# Patient Record
Sex: Female | Born: 1980 | Race: White | Hispanic: No | Marital: Married | Smoking: Never smoker
Health system: Southern US, Community
[De-identification: ages and names within clinical notes are randomized; demographics above are authoritative.]

## PROBLEM LIST (undated history)

## (undated) DIAGNOSIS — B977 Papillomavirus as the cause of diseases classified elsewhere: Secondary | ICD-10-CM

## (undated) DIAGNOSIS — Z87820 Personal history of traumatic brain injury: Secondary | ICD-10-CM

## (undated) DIAGNOSIS — F329 Major depressive disorder, single episode, unspecified: Secondary | ICD-10-CM

## (undated) DIAGNOSIS — F32A Depression, unspecified: Secondary | ICD-10-CM

## (undated) DIAGNOSIS — R87629 Unspecified abnormal cytological findings in specimens from vagina: Secondary | ICD-10-CM

## (undated) HISTORY — DX: Unspecified abnormal cytological findings in specimens from vagina: R87.629

## (undated) HISTORY — PX: LYMPH GLAND EXCISION: SHX13

## (undated) HISTORY — PX: NO PAST SURGERIES: SHX2092

## (undated) HISTORY — DX: Depression, unspecified: F32.A

## (undated) HISTORY — DX: Papillomavirus as the cause of diseases classified elsewhere: B97.7

## (undated) HISTORY — DX: Personal history of traumatic brain injury: Z87.820

---
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
## 2004-05-20 ENCOUNTER — Other Ambulatory Visit: Admission: RE | Admit: 2004-05-20 | Discharge: 2004-05-20 | Payer: Self-pay | Admitting: Cardiology

## 2005-06-26 ENCOUNTER — Other Ambulatory Visit: Admission: RE | Admit: 2005-06-26 | Discharge: 2005-06-26 | Payer: Self-pay | Admitting: *Deleted

## 2006-12-31 ENCOUNTER — Ambulatory Visit (HOSPITAL_COMMUNITY): Admission: RE | Admit: 2006-12-31 | Discharge: 2006-12-31 | Payer: Self-pay | Admitting: Surgery

## 2006-12-31 ENCOUNTER — Encounter (INDEPENDENT_AMBULATORY_CARE_PROVIDER_SITE_OTHER): Payer: Self-pay | Admitting: Surgery

## 2010-10-21 NOTE — Op Note (Signed)
Erin [name], Erin [name]                 ACCOUNT NO.:  [id number]   MEDICAL RECORD NO.:  [id number]          PATIENT TYPE:  AMB   LOCATION:  DAY                          FACILITY:  [facility]   PHYSICIAN:  Currie Paris, M.D.DATE OF BIRTH:  [birth year]   DATE OF PROCEDURE:  12/31/2006  DATE OF DISCHARGE:                               OPERATIVE REPORT   PREOPERATIVE DIAGNOSIS:  Cervical adenopathy.   POSTOPERATIVE DIAGNOSIS:  Cervical adenopathy.   OPERATION:  Excisional biopsy left cervical lymph node.   SURGEON:  Currie Paris, M.D.   ANESTHESIA:  Local.   CLINICAL HISTORY:  This is a 30 year old lady who has had some  adenopathy that has been followed.  This has never completely resolved  and she has developed a couple of other slightly enlarged nodes as well.  Clinically, they were all fairly soft but because of their persistence,  it was elected to proceed to a biopsy.   DESCRIPTION OF PROCEDURE:  The patient was seen in the holding area and  she had no further questions.  We confirmed the location of the node in  question in the left posterior cervical triangle and this was marked in  the holding area.  The patient was then taken to the operating room and  positioned on the operating table.  She was given some IV sedation and  the area over the lymph node was prepped and draped.  The time out  occurred.   I infiltrated 1% Xylocaine with some epinephrine, waited about 8 minutes  for that to soak in good.  I then made a short transverse incision.  I  used magnification for visualization.  I divided the subcu tissue with  the scissors and then also opened what appeared to be a very thin layer  of platysma.  A little bit of blunt dissection revealed the lymph node  and this was carefully dissected out, staying very close to the node.  What appeared to be the spinal accessory nerve was noted just posterior  and deep to this lymph node, but it was carefully avoided.  By  staying  close to the lymph node, I felt that we did not interfere with any other  superficial nerves.  This came out intact and was sent in saline to  pathology.   Since everything was dry, we went ahead and closed with some 4-0 Vicryl  plus Dermabond.  The patient tolerated the procedure well and there were  no complications.  All counts were correct.      Currie Paris, M.D.  Electronically Signed     CJS/MEDQ  D:  12/31/2006  T:  12/31/2006  Job:  [id number]   cc:   Soyla Murphy. Renne Crigler, M.D.  Fax: [phone]

## 2011-03-23 LAB — PREGNANCY, URINE: Preg Test, Ur: NEGATIVE

## 2011-03-23 LAB — HEMOGLOBIN AND HEMATOCRIT, BLOOD
HCT: 38.8
Hemoglobin: 13.1

## 2011-07-17 ENCOUNTER — Other Ambulatory Visit: Payer: Self-pay | Admitting: Dermatology

## 2017-04-19 DIAGNOSIS — Z87820 Personal history of traumatic brain injury: Secondary | ICD-10-CM

## 2017-04-19 HISTORY — DX: Personal history of traumatic brain injury: Z87.820

## 2017-12-28 DIAGNOSIS — Z6825 Body mass index (BMI) 25.0-25.9, adult: Secondary | ICD-10-CM | POA: Diagnosis not present

## 2017-12-28 DIAGNOSIS — Z01419 Encounter for gynecological examination (general) (routine) without abnormal findings: Secondary | ICD-10-CM | POA: Diagnosis not present

## 2018-01-28 ENCOUNTER — Encounter: Payer: Self-pay | Admitting: Family Medicine

## 2018-01-28 ENCOUNTER — Telehealth: Payer: Self-pay | Admitting: Family Medicine

## 2018-01-28 ENCOUNTER — Ambulatory Visit (INDEPENDENT_AMBULATORY_CARE_PROVIDER_SITE_OTHER): Payer: BLUE CROSS/BLUE SHIELD | Admitting: Family Medicine

## 2018-01-28 VITALS — BP 108/58 | HR 68 | Temp 98.6°F | Ht 67.5 in | Wt 165.0 lb

## 2018-01-28 DIAGNOSIS — Z13220 Encounter for screening for lipoid disorders: Secondary | ICD-10-CM

## 2018-01-28 DIAGNOSIS — E559 Vitamin D deficiency, unspecified: Secondary | ICD-10-CM

## 2018-01-28 DIAGNOSIS — Z114 Encounter for screening for human immunodeficiency virus [HIV]: Secondary | ICD-10-CM

## 2018-01-28 DIAGNOSIS — R42 Dizziness and giddiness: Secondary | ICD-10-CM | POA: Diagnosis not present

## 2018-01-28 DIAGNOSIS — N912 Amenorrhea, unspecified: Secondary | ICD-10-CM | POA: Diagnosis not present

## 2018-01-28 DIAGNOSIS — Z87820 Personal history of traumatic brain injury: Secondary | ICD-10-CM

## 2018-01-28 LAB — CBC WITH DIFFERENTIAL/PLATELET
Basophils Absolute: 0.1 10*3/uL (ref 0.0–0.1)
Basophils Relative: 0.8 % (ref 0.0–3.0)
Eosinophils Absolute: 0.2 10*3/uL (ref 0.0–0.7)
Eosinophils Relative: 2.8 % (ref 0.0–5.0)
HCT: 38.1 % (ref 36.0–46.0)
Hemoglobin: 12.8 g/dL (ref 12.0–15.0)
Lymphocytes Relative: 35.5 % (ref 12.0–46.0)
Lymphs Abs: 2.2 10*3/uL (ref 0.7–4.0)
MCHC: 33.5 g/dL (ref 30.0–36.0)
MCV: 82.3 fl (ref 78.0–100.0)
Monocytes Absolute: 0.5 10*3/uL (ref 0.1–1.0)
Monocytes Relative: 7.9 % (ref 3.0–12.0)
Neutro Abs: 3.3 10*3/uL (ref 1.4–7.7)
Neutrophils Relative %: 53 % (ref 43.0–77.0)
Platelets: 282 10*3/uL (ref 150.0–400.0)
RBC: 4.63 Mil/uL (ref 3.87–5.11)
RDW: 13.4 % (ref 11.5–15.5)
WBC: 6.3 10*3/uL (ref 4.0–10.5)

## 2018-01-28 LAB — COMPREHENSIVE METABOLIC PANEL
ALT: 8 U/L (ref 0–35)
AST: 11 U/L (ref 0–37)
Albumin: 4.3 g/dL (ref 3.5–5.2)
Alkaline Phosphatase: 43 U/L (ref 39–117)
BUN: 14 mg/dL (ref 6–23)
CO2: 29 mEq/L (ref 19–32)
Calcium: 9.5 mg/dL (ref 8.4–10.5)
Chloride: 106 mEq/L (ref 96–112)
Creatinine, Ser: 0.86 mg/dL (ref 0.40–1.20)
GFR: 77.98 mL/min (ref >60.00)
Glucose, Bld: 73 mg/dL (ref 70–99)
Potassium: 4.2 mEq/L (ref 3.5–5.1)
Sodium: 140 mEq/L (ref 135–145)
Total Bilirubin: 0.6 mg/dL (ref 0.2–1.2)
Total Protein: 6.8 g/dL (ref 6.0–8.3)

## 2018-01-28 LAB — LIPID PANEL
Cholesterol: 178 mg/dL (ref 0–200)
HDL: 44.1 mg/dL (ref >39.00)
LDL Cholesterol: 105 mg/dL — ABNORMAL HIGH (ref 0–99)
NonHDL: 133.65
Total CHOL/HDL Ratio: 4
Triglycerides: 143 mg/dL (ref 0.0–149.0)
VLDL: 28.6 mg/dL (ref 0.0–40.0)

## 2018-01-28 LAB — HCG, QUANTITATIVE, PREGNANCY: Quantitative HCG: 0.09 m[IU]/mL

## 2018-01-28 LAB — TSH: TSH: 1.69 u[IU]/mL (ref 0.35–4.50)

## 2018-01-28 LAB — VITAMIN B12: Vitamin B-12: 275 pg/mL (ref 211–911)

## 2018-01-28 LAB — VITAMIN D 25 HYDROXY (VIT D DEFICIENCY, FRACTURES): VITD: 23.14 ng/mL — ABNORMAL LOW (ref 30.00–100.00)
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
## 2018-01-28 NOTE — Telephone Encounter (Signed)
There is a possible concussion referral in the [facility] for this patient.  Can you please follow up to schedule on Monday? Thanks

## 2018-01-29 ENCOUNTER — Encounter: Payer: Self-pay | Admitting: Family Medicine

## 2018-01-29 LAB — HIV ANTIBODY (ROUTINE TESTING W REFLEX): HIV 1&2 Ab, 4th Generation: NONREACTIVE

## 2018-01-31 ENCOUNTER — Telehealth: Payer: Self-pay

## 2018-01-31 LAB — THYROID PEROXIDASE ANTIBODY: Thyroperoxidase Ab SerPl-aCnc: 7 IU/mL (ref <9)

## 2018-01-31 NOTE — Telephone Encounter (Signed)
Left message for patient to call back to be scheduled in concussion clinic.  
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
## 2018-02-03 ENCOUNTER — Ambulatory Visit (INDEPENDENT_AMBULATORY_CARE_PROVIDER_SITE_OTHER): Payer: BLUE CROSS/BLUE SHIELD | Admitting: Family Medicine

## 2018-02-03 ENCOUNTER — Encounter: Payer: Self-pay | Admitting: Family Medicine

## 2018-02-03 VITALS — BP 112/82 | HR 67 | Ht 67.5 in | Wt 165.0 lb

## 2018-02-03 DIAGNOSIS — R42 Dizziness and giddiness: Secondary | ICD-10-CM | POA: Diagnosis not present

## 2018-02-03 DIAGNOSIS — R293 Abnormal posture: Secondary | ICD-10-CM | POA: Diagnosis not present

## 2018-02-03 DIAGNOSIS — H8123 Vestibular neuronitis, bilateral: Secondary | ICD-10-CM | POA: Insufficient documentation

## 2018-02-03 MED ORDER — VITAMIN D (ERGOCALCIFEROL) 1.25 MG (50000 UNIT) PO CAPS: 50000.0000 [IU] | ORAL_CAPSULE | ORAL | 0 refills | Status: DC

## 2018-02-03 MED ORDER — PREDNISONE 50 MG PO TABS: 50.0000 mg | ORAL_TABLET | Freq: Every day | ORAL | 0 refills | Status: DC

## 2018-02-03 NOTE — Assessment & Plan Note (Signed)
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

## 2018-02-03 NOTE — Patient Instructions (Signed)
Good to see you  Once weekly vitamin D for 12 weeks Prednisone daily for 5 days  PT will call you or you can call them at [phone] Tart cherry extract any dose at night See me again in 3-4 weeks to make sure you are all the way better
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
## 2018-02-03 NOTE — Assessment & Plan Note (Signed)
Normally the patient does have more of a vestibular neuronitis that is likely contributing.  Patient does not have any other signs or symptoms that would be consistent with a postconcussive syndrome.  Discussed though that patient does have some difficulty with the vitamin D being low and this could be contributing to some of the slowing in the healing aspect.  Will refer patient to formal physical therapy that I think will be beneficial, discussed over-the-counter medications, did not give any exercises ourselves at this point.  Follow-up again in 3 to 4 weeks

## 2018-02-28 ENCOUNTER — Ambulatory Visit: Payer: BLUE CROSS/BLUE SHIELD | Admitting: Family Medicine

## 2018-03-04 ENCOUNTER — Other Ambulatory Visit: Payer: Self-pay

## 2018-03-04 ENCOUNTER — Encounter: Payer: Self-pay | Admitting: Physical Therapy

## 2018-03-04 ENCOUNTER — Ambulatory Visit: Payer: BLUE CROSS/BLUE SHIELD | Attending: Family Medicine | Admitting: Physical Therapy

## 2018-03-04 DIAGNOSIS — R42 Dizziness and giddiness: Secondary | ICD-10-CM
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
## 2018-03-08 ENCOUNTER — Ambulatory Visit: Payer: BLUE CROSS/BLUE SHIELD | Attending: Family Medicine | Admitting: Physical Therapy

## 2018-03-08 ENCOUNTER — Encounter: Payer: Self-pay | Admitting: Physical Therapy

## 2018-03-08 DIAGNOSIS — R42 Dizziness and giddiness: Secondary | ICD-10-CM

## 2018-03-08 NOTE — Therapy (Signed)
[facility] [address] [place], [place], [address] Phone: [phone]   Fax:  [phone]  Physical Therapy Treatment  Patient Details  Name: Erin [name] MRN: [id number] Date of Birth: 07-Nov-1980 Referring Provider (PT): Dr. Charlann Boxer   Encounter Date: 03/08/2018  PT End of Session - 03/08/18 0831    Visit Number  2    Number of Visits  5    Date for PT Re-Evaluation  04/01/18    PT Start Time  0830    PT Stop Time  0910    PT Time Calculation (min)  40 min    Activity Tolerance  Patient tolerated treatment well    Behavior During Therapy  [facility] for tasks assessed/performed       Past Medical History:  Diagnosis Date  . History of concussion 04/19/2017  . HPV in female     History reviewed. No pertinent surgical history.  There were no vitals filed for this visit.  Subjective Assessment - 03/08/18 0830    Subjective  no dizzy episodes since last Friday - but has not been to the gym or down to the floor - plans to go to the gym tonight; has been doing HEP with good compliance.    Pertinent History  no pertinent medical history    Diagnostic tests  CT: normal    Patient Stated Goals  improve dizziness    Currently in Pain?  No/denies             Vestibular Assessment - 03/08/18 0835      Positional Testing   Dix-Hallpike  Dix-Hallpike Right;Dix-Hallpike Left      Dix-Hallpike Right   Dix-Hallpike Right Symptoms  No nystagmus      Dix-Hallpike Left   Dix-Hallpike Left Symptoms  No nystagmus               [facility] Adult PT Treatment/Exercise - 03/08/18 0001      Exercises   Exercises  Neck      Neck Exercises: Stretches   Levator Stretch  Right;Left;3 reps;30 seconds      Vestibular Treatment/Exercise - 03/08/18 0001      Vestibular Treatment/Exercise   Vestibular Treatment Provided  Gaze    Gaze Exercises  X1 Viewing Horizontal;X1 Viewing Vertical;X2 Viewing Horizontal;X2  Viewing Vertical      X1 Viewing Horizontal   Foot Position  seated, standing feet apart, standing feet together    Time  --   30-45 sec   Reps  2   each position     X1 Viewing Vertical   Foot Position  seated, standing feet apart, standing feet together    Time  --   30-45 sec   Reps  2   each position     X2 Viewing Horizontal   Foot Position  seated    Time  --   30-45 sec   Reps  2      X2 Viewing Vertical   Foot Position  seated    Time  --   30-45 sec   Reps  2         Balance Exercises - 03/08/18 0858      Balance Exercises: Standing   Standing Eyes Closed  Narrow base of support (BOS);Foam/compliant surface;Head turns;30 secs;2 reps   2 reps EC; 2 reps EC + head turns; moderate sway   Tandem Stance  Eyes closed;Foam/compliant surface;3 reps;30 secs   moderate sway - 1 LOB  requiring UE support on wall         PT Short Term Goals - 03/04/18 1315      PT SHORT TERM GOAL #1   Title  STGs = LTGs        PT Long Term Goals - 03/04/18 1316      PT LONG TERM GOAL #1   Title  Patient to be independent with advanced HEP for cervical and vestibular activities    Time  4    Period  Weeks    Status  New    Target Date  04/01/18      PT LONG TERM GOAL #2   Title  patient to report reduction in frequency of dizziness related symptoms by >/= 50%    Time  4    Period  Weeks    Status  New    Target Date  04/01/18      PT LONG TERM GOAL #3   Title  patient to report ability to exercise without dizziness limiting    Time  4    Period  Weeks    Status  New    Target Date  04/01/18      PT LONG TERM GOAL #4   Title  patient to maintain balance with EC on various surfaces with minimal sway    Time  4    Period  Weeks    Status  New    Target Date  04/01/18            Plan - 03/08/18 1937    Clinical Impression Statement  Patient reporting no dizzy symptoms since last Friday - however has not attempted trying to go to the gym - plans for  today. Patient with improved toelrance to VOR x 1 today with ability to progress velocity of head turns as compared to last session. Progressed HEP with levator scap stretch as well as VOR x 2 with good tolerance and carryover. Making good progress towards goals.     Rehab Potential  Good    PT Frequency  1x / week    PT Duration  4 weeks    PT Treatment/Interventions  ADLs/Self Care Home Management;Canalith Repostioning;Cryotherapy;Electrical Stimulation;Traction;Moist Heat;Therapeutic activities;Therapeutic exercise;Balance training;Patient/family education;Neuromuscular re-education;Manual techniques;Vestibular;Taping;Dry needling    PT Next Visit Plan  re-assess as needed, progress VOR as able    PT Home Exercise Plan  [id number]    Consulted and Agree with Plan of Care  Patient       Patient will benefit from skilled therapeutic intervention in order to improve the following deficits and impairments:  Decreased balance, Dizziness, Postural dysfunction  Visit Diagnosis: Dizziness and giddiness     Problem List Patient Active Problem List   Diagnosis Date Noted  . 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 Poor posture 02/03/2018    [name], PT, DPT Supplemental Physical Therapist 03/08/18 9:12 AM Pager: [phone] Office: [facility] [address] [place], [place], [address] Phone: [phone]   Fax:  [phone]  Name: Erin [name] MRN: [id number] Date of Birth: 1980/07/01

## 2018-03-15 ENCOUNTER — Ambulatory Visit: Payer: BLUE CROSS/BLUE SHIELD | Admitting: Physical Therapy

## 2018-03-15 ENCOUNTER — Encounter: Payer: Self-pay | Admitting: Physical Therapy

## 2018-03-15 DIAGNOSIS — R42 Dizziness and giddiness: Secondary | ICD-10-CM

## 2018-03-15 NOTE — Therapy (Signed)
[facility] [address] [place], [place], [address] Phone: [phone]   Fax:  [phone]  Physical Therapy Treatment  Patient Details  Name: Erin [name] MRN: [id number] Date of Birth: 01-30-1981 Referring Provider (PT): Dr. Charlann Boxer   Encounter Date: 03/15/2018  PT End of Session - 03/15/18 0853    Visit Number  3    Number of Visits  5    Date for PT Re-Evaluation  04/01/18    PT Start Time  0851    PT Stop Time  0930    PT Time Calculation (min)  39 min    Activity Tolerance  Patient tolerated treatment well    Behavior During Therapy  [facility] for tasks assessed/performed       Past Medical History:  Diagnosis Date  . History of concussion 04/19/2017  . HPV in female     History reviewed. No pertinent surgical history.  There were no vitals filed for this visit.  Subjective Assessment - 03/15/18 0852    Subjective  had some dizziness - sitting on the floor and lying back; very stressful week    Pertinent History  no pertinent medical history    Diagnostic tests  CT: normal    Patient Stated Goals  improve dizziness    Currently in Pain?  No/denies             Vestibular Assessment - 03/15/18 0001      Positional Testing   Dix-Hallpike  Dix-Hallpike Right;Dix-Hallpike Left      Dix-Hallpike Right   Dix-Hallpike Right Symptoms  No nystagmus      Dix-Hallpike Left   Dix-Hallpike Left Duration  ~10 seconds    Dix-Hallpike Left Symptoms  Upbeat, left rotatory nystagmus               [facility] Adult PT Treatment/Exercise - 03/15/18 0001      Manual Therapy   Manual Therapy  Soft tissue mobilization    Manual therapy comments  patient supine    Soft tissue mobilization  STM to neck and upper back musculature; palpable tissue tightness noted with pain R>L. education on continued stretching and AORM      Vestibular Treatment/Exercise - 03/15/18 0001      Vestibular Treatment/Exercise    Vestibular Treatment Provided  Canalith Repositioning;Habituation;Gaze    Canalith Repositioning  Epley Manuever Left    Habituation Exercises  Comment   education on sit to supine habituation   Gaze Exercises  X1 Viewing Horizontal;X2 Viewing Horizontal       EPLEY MANUEVER LEFT   Number of Reps   2    Overall Response   Improved Symptoms     RESPONSE DETAILS LEFT  2 reps with little to no symptoms on 2nd rep; no evidence of nystagmus during second trial      X1 Viewing Horizontal   Foot Position  seated following Epley - minor increase in nausea    Time  --   30 sec   Reps  2      X2 Viewing Horizontal   Foot Position  standing feet together    Time  --   30 sec   Reps  2     Comments  increase in nausea symptoms with reduction in symptoms with rest and water         Balance Exercises - 03/15/18 0945      Balance Exercises: Standing   Standing Eyes Closed  Narrow base  of support (BOS);Foam/compliant surface;Head turns;30 secs;3 reps   1 set no head turns; 1 set with horiz. and vert. head turns         PT Short Term Goals - 03/04/18 1315      PT SHORT TERM GOAL #1   Title  STGs = LTGs        PT Long Term Goals - 03/04/18 1316      PT LONG TERM GOAL #1   Title  Patient to be independent with advanced HEP for cervical and vestibular activities    Time  4    Period  Weeks    Status  New    Target Date  04/01/18      PT LONG TERM GOAL #2   Title  patient to report reduction in frequency of dizziness related symptoms by >/= 50%    Time  4    Period  Weeks    Status  New    Target Date  04/01/18      PT LONG TERM GOAL #3   Title  patient to report ability to exercise without dizziness limiting    Time  4    Period  Weeks    Status  New    Target Date  04/01/18      PT LONG TERM GOAL #4   Title  patient to maintain balance with EC on various surfaces with minimal sway    Time  4    Period  Weeks    Status  New    Target Date  04/01/18             Plan - 03/15/18 0935    Clinical Impression Statement  Re-assessment of positional vertigo with positive response of L ear. Patient with subjective reports of room spinning and nausea with symptoms lasting ~10 seconds and resolving following Epley maneuver. Re-tested following maneuver with much improved symptoms. Progression of VOR x2 in standing with feet together with good tolerance. Manual therapy to B neck adn upper back musculature with palpable tightness - education to continue stretching and AROM exercises at C-spine. Hopeful for 1 follow-up next week and progression to independent HEP, however, depending on symptoms.     Rehab Potential  Good    PT Frequency  1x / week    PT Duration  4 weeks    PT Treatment/Interventions  ADLs/Self Care Home Management;Canalith Repostioning;Cryotherapy;Electrical Stimulation;Traction;Moist Heat;Therapeutic activities;Therapeutic exercise;Balance training;Patient/family education;Neuromuscular re-education;Manual techniques;Vestibular;Taping;Dry needling    PT Next Visit Plan  re-assess as needed, progress VOR as able    PT Home Exercise Plan  [id number]    Consulted and Agree with Plan of Care  Patient       Patient will benefit from skilled therapeutic intervention in order to improve the following deficits and impairments:  Decreased balance, Dizziness, Postural dysfunction  Visit Diagnosis: Dizziness and giddiness     Problem List Patient Active Problem List   Diagnosis Date Noted  . 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 Poor posture 02/03/2018    [name], PT, DPT Supplemental Physical Therapist 03/15/18 1:02 PM Pager: [phone] Office: [facility] [address] [place], [place], [address] Phone: [phone]   Fax:  [phone]  Name: Erin [name] MRN: [id number] Date of Birth: 08-05-1980

## 2018-03-15 NOTE — Patient Instructions (Signed)

## 2018-03-22 ENCOUNTER — Ambulatory Visit: Payer: BLUE CROSS/BLUE SHIELD | Admitting: Physical Therapy

## 2018-03-29 ENCOUNTER — Encounter: Payer: Self-pay | Admitting: Physical Therapy

## 2018-03-29 ENCOUNTER — Ambulatory Visit: Payer: BLUE CROSS/BLUE SHIELD | Admitting: Physical Therapy

## 2018-03-29 DIAGNOSIS — R42 Dizziness and giddiness: Secondary | ICD-10-CM

## 2018-03-29 NOTE — Therapy (Signed)
[facility] [address] [place], [place], [address] Phone: [phone]   Fax:  [phone]  Physical Therapy Treatment & Discharge Summary   Patient Details  Name: Erin [name] MRN: [id number] Date of Birth: [birth year] Referring Provider (PT): Dr. Charlann Boxer   Encounter Date: 03/29/2018  PT End of Session - 03/29/18 1027    Visit Number  4    Number of Visits  5    Date for PT Re-Evaluation  04/01/18    PT Start Time  0930    PT Stop Time  1000    PT Time Calculation (min)  30 min    Activity Tolerance  Patient tolerated treatment well    Behavior During Therapy  [facility] for tasks assessed/performed       Past Medical History:  Diagnosis Date  . History of concussion 04/19/2017  . HPV in female     History reviewed. No pertinent surgical history.  There were no vitals filed for this visit.  Subjective Assessment - 03/29/18 0934    Subjective  Reports her dizziness symptoms are improving. Reports mild dizziness when sitting back 2/2 son pushing her backwards. Believes her symptoms are dampened and improving overall.     Pertinent History  no pertinent medical history    Diagnostic tests  CT: normal    Patient Stated Goals  improve dizziness    Currently in Pain?  No/denies             Vestibular Assessment - 03/29/18 1019      Vestibulo-Occular Reflex   VOR 1 Head Only (x 1 viewing)  --    VOR 2 Head and Object (x 2 viewing)  --      Positional Testing   Dix-Hallpike  Dix-Hallpike Left      Dix-Hallpike Left   Dix-Hallpike Left Duration  5-10 seconds     Dix-Hallpike Left Symptoms  Upbeat, left rotatory nystagmus               [facility] Adult PT Treatment/Exercise - 03/29/18 1029      Balance   Balance Assessed  Yes      Static Standing Balance   Rhomberg - Eyes Closed  30   static standing eyes closed on foam with min sway      Vestibular Treatment/Exercise - 03/29/18 1022      Vestibular Treatment/Exercise   Vestibular Treatment Provided  Canalith Repositioning;Gaze    Canalith Repositioning  Epley Manuever Left    Gaze Exercises  X1 Viewing Horizontal;X2 Viewing Horizontal      X1 Viewing Horizontal   Foot Position  Pt performs x1 trial of x1 viewing in standing for 30 sec duration progressing to x1 trial for 60 sec duration with no reproduction of symptoms.     Time  --   30 seconds progressing to 1 min    Reps  2      X2 Viewing Horizontal   Foot Position  Pt performs x1 trial for 30 sec duration performing with horizontal head turns. Pt progresses to 60 sec duration with no reproduction of symptoms.    romberg standing   Time  --   30 seconds progressing to 1 min   Reps  2     Comments  no reproduction of symptoms       X2 Viewing Vertical   Foot Position  Pt performs x1 trial for 30 sec duration performing with vertical head turns. Progresses to 33  sec duration with no reproduction of symptoms.    romberg standing   Time  --   30 seconds progressing to 1 min   Reps  2            PT Education - 03/29/18 1026    Education Details  Therapist provided education regarding exam findings, updated gaze stabilization exercises, and to return to clinic if symptoms were to worsen.     Person(s) Educated  Patient    Methods  Explanation    Comprehension  Verbalized understanding;Returned demonstration       PT Short Term Goals - 03/04/18 1315      PT SHORT TERM GOAL #1   Title  STGs = LTGs        PT Long Term Goals - 03/29/18 1028      PT LONG TERM GOAL #1   Title  Patient to be independent with advanced HEP for cervical and vestibular activities    Time  4    Period  Weeks    Status  Achieved      PT LONG TERM GOAL #2   Title  patient to report reduction in frequency of dizziness related symptoms by >/= 50%    Time  4    Period  Weeks    Status  Achieved      PT LONG TERM GOAL #3   Title  patient to report ability to exercise  without dizziness limiting    Time  4    Period  Weeks    Status  Achieved      PT LONG TERM GOAL #4   Title  patient to maintain balance with EC on various surfaces with minimal sway    Time  4    Period  Weeks    Status  Achieved            Plan - 03/29/18 1030    Clinical Impression Statement  Today's skilled session focused on re assessment of positional vertigo, updating HEP gaze stabilization exercises, and checking LTG's. Therapist notes positive left beat upward rotary nystagmus during [facility] with patient reporting mild onset of dizziness. Therapist treated with x1 Epley manuever with reduction in symptoms. Therapist updated patient's gaze stabilization HEP exercises with patient reporting no reproduction of symptoms with increased time. Pt demonstrates improvement in standing balance indicated by minimal sway noted while standing on foam with her eyes closed to increase challenge on her vestibular system. Overall, patient has met 4/4 LTG's demonstrating independence with HEP, reporting decreased frequency of dizziness with ability to exercise without dizziness onset, and improved standing balance with increased vestibular challenge. Therapist recommends patient be discharged from PT and educates patient to continue performing her habituation and gaze stabilization exercises at home.     Rehab Potential  Good    PT Frequency  1x / week    PT Duration  4 weeks    PT Treatment/Interventions  ADLs/Self Care Home Management;Canalith Repostioning;Cryotherapy;Electrical Stimulation;Traction;Moist Heat;Therapeutic activities;Therapeutic exercise;Balance training;Patient/family education;Neuromuscular re-education;Manual techniques;Vestibular;Taping;Dry needling    PT Next Visit Plan  --    [place]    Consulted and Agree with Plan of Care  Patient       Patient will benefit from skilled therapeutic intervention in order to improve the following deficits  and impairments:  Decreased balance, Dizziness, Postural dysfunction  Visit Diagnosis: Dizziness and giddiness     Problem List Patient Active Problem List   Diagnosis Date  Noted  . 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 Poor posture 02/03/2018    Floreen Comber, SPT 03/29/2018, 10:36 AM   PHYSICAL THERAPY DISCHARGE SUMMARY  Visits from Start of Care: 4  Current functional level related to goals / functional outcomes: All LTGs met; able to return to gym without symptom provocation   Remaining deficits: Mild dizziness, however, patient reports independence with symptom management and prevention   Education / Equipment: HEP  Plan: Patient agrees to discharge.  Patient goals were met. Patient is being discharged due to meeting the stated rehab goals.  ?????    [name], PT, DPT Supplemental Physical Therapist 03/29/18 10:48 AM Pager: [phone] Office: [facility] [address] [place], [place], [address] Phone: [phone]   Fax:  [phone]  Name: Erin [name] MRN: [id number] Date of Birth: 09-01-1980

## 2018-04-01 DIAGNOSIS — Z23 Encounter for immunization: Secondary | ICD-10-CM | POA: Diagnosis not present

## 2018-04-21 DIAGNOSIS — N911 Secondary amenorrhea: Secondary | ICD-10-CM | POA: Diagnosis not present

## 2018-04-25 ENCOUNTER — Other Ambulatory Visit: Payer: Self-pay | Admitting: Family Medicine

## 2018-04-28 DIAGNOSIS — Z3685 Encounter for antenatal screening for Streptococcus B: Secondary | ICD-10-CM | POA: Diagnosis not present

## 2018-04-28 DIAGNOSIS — Z3481 Encounter for supervision of other normal pregnancy, first trimester: Secondary | ICD-10-CM | POA: Diagnosis not present

## 2018-04-28 LAB — OB RESULTS CONSOLE ANTIBODY SCREEN: Antibody Screen: NEGATIVE

## 2018-04-28 LAB — OB RESULTS CONSOLE RUBELLA ANTIBODY, IGM: Rubella: IMMUNE

## 2018-04-28 LAB — OB RESULTS CONSOLE ABO/RH: RH Type: POSITIVE

## 2018-04-28 LAB — OB RESULTS CONSOLE GC/CHLAMYDIA
Chlamydia: NEGATIVE
Gonorrhea: NEGATIVE

## 2018-04-28 LAB — OB RESULTS CONSOLE HEPATITIS B SURFACE ANTIGEN: Hepatitis B Surface Ag: NEGATIVE

## 2018-04-28 LAB — OB RESULTS CONSOLE HIV ANTIBODY (ROUTINE TESTING): HIV: NONREACTIVE

## 2018-04-28 LAB — OB RESULTS CONSOLE RPR: RPR: NONREACTIVE

## 2018-05-09 DIAGNOSIS — Z113 Encounter for screening for infections with a predominantly sexual mode of transmission: Secondary | ICD-10-CM | POA: Diagnosis not present

## 2018-05-09 DIAGNOSIS — Z3491 Encounter for supervision of normal pregnancy, unspecified, first trimester: Secondary | ICD-10-CM | POA: Diagnosis not present

## 2018-05-26 DIAGNOSIS — O09521 Supervision of elderly multigravida, first trimester: Secondary | ICD-10-CM | POA: Diagnosis not present

## 2018-05-26 DIAGNOSIS — Z3682 Encounter for antenatal screening for nuchal translucency: Secondary | ICD-10-CM | POA: Diagnosis not present

## 2018-05-26 DIAGNOSIS — Z3A12 12 weeks gestation of pregnancy: Secondary | ICD-10-CM | POA: Diagnosis not present

## 2018-06-09 NOTE — Progress Notes (Signed)
   Erin [name] is a 38 y.o. female here for an acute visit.  History of Present Illness:   Erin [name], CMA acting as scribe for Dr. Briscoe Deutscher.   HPI: Growth above right elbow. Patient noticed about four months ago. It is not itchy, painful or red. She has never noticed that the skin is changed in any way. She can not tell if their has been any change in size. She denies any injury to the area. She is [redacted] weeks pregnant.    PMHx, SurgHx, SocialHx, Medications, and Allergies were reviewed in the Visit Navigator and updated as appropriate.  Current Medications:   Marland Kitchen  Multiple Vitamin (MULTIVITAMIN) tablet, Take 1 tablet by mouth daily., Disp: , Rfl:    No Known Allergies   Review of Systems:   Pertinent items are noted in the HPI. Otherwise, ROS is negative.  Vitals:   Vitals:   06/10/18 0759  BP: 110/82  Pulse: 85  Temp: 98.6 F (37 C)  TempSrc: Oral  Weight: 174 lb 9.6 oz (79.2 kg)  Height: 5' 7.5" (1.715 m)     Body mass index is 26.94 kg/m.  Physical Exam:   Physical Exam Vitals signs and nursing note reviewed.  HENT:     Head: Normocephalic and atraumatic.  Eyes:     Pupils: Pupils are equal, round, and reactive to light.  Neck:     Musculoskeletal: Normal range of motion and neck supple.  Cardiovascular:     Rate and Rhythm: Normal rate and regular rhythm.     Heart sounds: Normal heart sounds.  Pulmonary:     Effort: Pulmonary effort is normal.  Abdominal:     Palpations: Abdomen is soft.  Skin:    General: Skin is warm.  Psychiatric:        Behavior: Behavior normal.       Assessment and Plan:   Erin [name] was seen today for cyst.  Diagnoses and all orders for this visit:  Mass of skin of elbow, right Comments: Unclear etiology but benign appearing. Possible chronic mechanical irritation. Will bandage and protect for two weeks to see if this helps. If not, to Dr. Paulla [name].  [redacted] weeks gestation of pregnancy   . Reviewed expectations re:  course of current medical issues. . Discussed self-management of symptoms. . Outlined signs and symptoms indicating need for more acute intervention. . Patient verbalized understanding and all questions were answered. Marland Kitchen Health Maintenance issues including appropriate healthy diet, exercise, and smoking avoidance were discussed with patient. . See orders for this visit as documented in the electronic medical record. . Patient received an After Visit Summary.  CMA served as Education administrator during this visit. History, Physical, and Plan performed by medical provider. The above documentation has been reviewed and is accurate and complete. Briscoe Deutscher, D.O.  Briscoe Deutscher, DO [place], [place] [facility] 06/10/2018

## 2018-06-10 ENCOUNTER — Ambulatory Visit (INDEPENDENT_AMBULATORY_CARE_PROVIDER_SITE_OTHER): Payer: BLUE CROSS/BLUE SHIELD | Admitting: Family Medicine

## 2018-06-10 ENCOUNTER — Encounter: Payer: Self-pay | Admitting: Family Medicine

## 2018-06-10 VITALS — BP 110/82 | HR 85 | Temp 98.6°F | Ht 67.5 in | Wt 174.6 lb

## 2018-06-10 DIAGNOSIS — R2231 Localized swelling, mass and lump, right upper limb: Secondary | ICD-10-CM | POA: Diagnosis not present

## 2018-06-10 DIAGNOSIS — Z3A14 14 weeks gestation of pregnancy: Secondary | ICD-10-CM | POA: Diagnosis not present

## 2018-06-10 NOTE — Patient Instructions (Signed)
We are bandaging your elbow. Keep it cushioned for two weeks. If not better, I want you to see Dr. Paulla Fore for ultrasound evaluation.

## 2018-07-13 DIAGNOSIS — Z361 Encounter for antenatal screening for raised alphafetoprotein level: Secondary | ICD-10-CM | POA: Diagnosis not present

## 2018-07-13 DIAGNOSIS — Z3A19 19 weeks gestation of pregnancy: Secondary | ICD-10-CM | POA: Diagnosis not present

## 2018-07-13 DIAGNOSIS — Z363 Encounter for antenatal screening for malformations: Secondary | ICD-10-CM | POA: Diagnosis not present

## 2018-07-13 DIAGNOSIS — Z3480 Encounter for supervision of other normal pregnancy, unspecified trimester: Secondary | ICD-10-CM | POA: Diagnosis not present

## 2018-07-13 DIAGNOSIS — O09522 Supervision of elderly multigravida, second trimester: Secondary | ICD-10-CM | POA: Diagnosis not present

## 2018-07-16 ENCOUNTER — Encounter: Payer: Self-pay | Admitting: Family Medicine

## 2018-07-18 ENCOUNTER — Telehealth: Payer: Self-pay | Admitting: Family Medicine

## 2018-07-18 NOTE — Telephone Encounter (Signed)
See other message for documentation.

## 2018-07-18 NOTE — Telephone Encounter (Signed)
Copied from [place]. Topic: General - Other >> Jul 18, 2018  9:25 AM Judyann Munson wrote: Reason for CRM: Patient is calling to state she was advise if her Mass on her right elbow did not go down to come in for a ultrasound. She is requesting a order be sent to DR. Paulla Fore. Please advise

## 2018-07-18 NOTE — Telephone Encounter (Signed)
Please advise ok to see? If so do I need to put in as new patient or something else?

## 2018-07-18 NOTE — Telephone Encounter (Signed)
Yes, there is another message about this already.

## 2018-07-18 NOTE — Telephone Encounter (Signed)
Ok to order 

## 2018-07-21 ENCOUNTER — Encounter: Payer: Self-pay | Admitting: Sports Medicine

## 2018-07-21 ENCOUNTER — Ambulatory Visit (INDEPENDENT_AMBULATORY_CARE_PROVIDER_SITE_OTHER): Payer: BLUE CROSS/BLUE SHIELD | Admitting: Sports Medicine

## 2018-07-21 ENCOUNTER — Ambulatory Visit: Payer: Self-pay

## 2018-07-21 VITALS — BP 90/54 | HR 86 | Ht 67.5 in | Wt 181.0 lb

## 2018-07-21 DIAGNOSIS — M7989 Other specified soft tissue disorders: Secondary | ICD-10-CM | POA: Diagnosis not present

## 2018-07-21 DIAGNOSIS — M79631 Pain in right forearm: Secondary | ICD-10-CM | POA: Diagnosis not present

## 2018-07-21 DIAGNOSIS — R2231 Localized swelling, mass and lump, right upper limb: Secondary | ICD-10-CM

## 2018-07-21 NOTE — Progress Notes (Signed)
Erin [name]. Erin [name], [name] - 38 y.o. female MRN [id number]  Date of birth: 13-Jan-1981  Visit Date: July 24, 2018  PCP: Briscoe Deutscher, DO   Referred by: Briscoe Deutscher, DO  SUBJECTIVE:  Chief Complaint  Patient presents with  . New Patient (Initial Visit)    Bump on R arm.  Erin pt.    HPI: Patient presents with 4 months of worsening swelling and palpable deformity of the posterior aspect of the proximal forearm directly over the ulna.  There is no pain but there is increasing swelling and irregularity of the tissue.  She is currently pregnant and has noticed that this is been worsening since becoming pregnant.  There is slight stippling of the skin.  She denies any limitations in her range of motion.  She is not taking any medications but has tried compression x2 weeks with only minimal improvement.  She does rest her elbow at work some but does not notice any discomfort  REVIEW OF SYSTEMS: She has had some changes in urinary habits due to the increased frequency from pregnancy.  She does have a history of migraines but denies any dizziness.  No other associated weight gain or weight loss.  Pregnancy is uncomplicated at this time. Otherwise 12 point review of systems performed and is negative   HISTORY:  Prior history reviewed and updated per electronic medical record.  Patient Active Problem List   Diagnosis Date Noted  . 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 Poor posture 02/03/2018   Social History   Occupational History  . Not on file  Tobacco Use  . Smoking status: Never Smoker  . Smokeless tobacco: Never Used  Substance and Sexual Activity  . Alcohol use: Yes    Alcohol/week: 1.0 standard drinks    Types: 1 Glasses of wine per week  . Drug use: Never  . Sexual activity: Not on file   Social History   Social History  Narrative  . Not on file   Past Medical History:  Diagnosis Date  . History of concussion 04/19/2017  . HPV in female    History reviewed. No pertinent surgical history. family history includes Breast cancer (age of onset: 49) in her paternal grandmother; Heart murmur in her brother; High Cholesterol in her mother; Macular degeneration in her maternal grandfather; Obesity in her father; Stroke in her paternal grandfather. Recent Labs    01/28/18 1006  CREATININE 0.86  CALCIUM 9.5  AST 11  ALT 8  TSH 1.69    OBJECTIVE:  VS:  HT:5' 7.5" (171.5 cm)   WT:181 lb (82.1 kg)  BMI:27.91    BP:(!) 90/54  HR:86bpm  TEMP: ( )  RESP:99 %   PHYSICAL EXAM: CONSTITUTIONAL: Well-developed, Well-nourished and In no acute distress EYES: Pupils are equal., EOM intact without nystagmus. and No scleral icterus. Psychiatric: Alert & appropriately interactive. and Not depressed or anxious appearing. EXTREMITY EXAM: Warm and well perfused  Right forearm is overall well aligned without significant deformity.  She does have a slight irregularity on the posterior aspect of the ulna with some stippling of the skin but no gross swelling or area of fluctuance.  Slight darkening of the skin in a stippled pattern.  No erythema.  Sensation is intact light touch.   ASSESSMENT:  1. Right forearm pain   2. Mass of skin of elbow, right  3. Fat necrosis of skin     PROCEDURES:  None  PLAN:  Pertinent additional documentation may be included in corresponding procedure notes, imaging studies, problem based documentation and patient instructions.  No problem-specific Assessment & Plan notes found for this encounter.   Symptoms are consistent with a area of slight traumatic superficial fat necrosis consistent with pressure related changes.  No evidence of overt bursitis.  Discussed the importance of avoiding direct compression over this area and additional padding with a compression sleeve or Ace  wrap.  Activity modifications and the importance of avoiding exacerbating activities (limiting pain to no more than a 4 / 10 during or following activity) recommended and discussed.  Discussed red flag symptoms that warrant earlier emergent evaluation and patient voices understanding.   No orders of the defined types were placed in this encounter.  Lab Orders  No laboratory test(s) ordered today    Imaging Orders     [place] [facility] POCT ULTRASOUND Referral Orders  No referral(s) requested today    Return if symptoms worsen or fail to improve.          Gerda Diss, [facility] Physician

## 2018-07-24 NOTE — Procedures (Signed)
LIMITED MSK ULTRASOUND OF Right elbow Images were obtained and interpreted by myself, Teresa Coombs, DO  Images have been saved and stored to PACS system. Images obtained on: GE S7 Ultrasound machine  FINDINGS:   Skin directly of the ulna has some increased area of hypoechoic change within the subcutaneous tissue with minimally associated neovascularity.  This is a diffuse finding.  There is no evidence of bursitis.  No osseous irregularity.  Superficial tissue appears to be deep only involved area.  IMPRESSION:  1. Superficial fat necrosis secondary to pressure related changes.

## 2018-08-01 ENCOUNTER — Ambulatory Visit: Payer: BLUE CROSS/BLUE SHIELD | Admitting: Family Medicine

## 2018-08-04 NOTE — Progress Notes (Signed)
Erin [name] is a 38 y.o. female is here for follow up.  History of Present Illness:   HPI: See Assessment and Plan section for Problem Based Charting of issues discussed today.   Health Maintenance Due  Topic Date Due  . PAP SMEAR-Modifier  11/03/1999   Depression screen PHQ 2/9 01/28/2018  Decreased Interest 0  Down, Depressed, Hopeless 0  PHQ - 2 Score 0  Altered sleeping 0  Tired, decreased energy 0  Change in appetite 0  Feeling bad or failure about yourself  0  Trouble concentrating 0  Moving slowly or fidgety/restless 0  Suicidal thoughts 0  PHQ-9 Score 0   PMHx, SurgHx, SocialHx, FamHx, Medications, and Allergies were reviewed in the Visit Navigator and updated as appropriate.   Patient Active Problem List   Diagnosis Date Noted  . Currently pregnant 08/06/2018  . 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 Poor posture 02/03/2018   Social History   Tobacco Use  . Smoking status: Never Smoker  . Smokeless tobacco: Never Used  Substance Use Topics  . Alcohol use: Yes    Alcohol/week: 1.0 standard drinks    Types: 1 Glasses of wine per week  . Drug use: Never   Current Medications and Allergies:   Current Outpatient Medications:  Marland Kitchen  Multiple Vitamin (MULTIVITAMIN) tablet, Take 1 tablet by mouth daily., Disp: , Rfl:  .  Prenatal Vit-Fe Fumarate-FA (PRENATAL 1+1 PO), Prenatal, Disp: , Rfl:   No Known Allergies Review of Systems   Pertinent items are noted in the HPI. Otherwise, ROS is negative.  Vitals:   Vitals:   08/05/18 1440  BP: 108/62  Pulse: 83  Temp: 98.1 F (36.7 C)  TempSrc: Oral  SpO2: 99%  Weight: 183 lb 12.8 oz (83.4 kg)  Height: 5\' 8"  (1.727 m)     Body mass index is 27.95 kg/m.  Physical Exam:   Physical Exam  Results for orders placed or performed in visit on 01/28/18  HIV antibody  Result Value Ref Range   HIV 1&2 Ab, 4th Generation NON-REACTIVE NON-REACTI  CBC with  Differential/Platelet  Result Value Ref Range   WBC 6.3 4.0 - 10.5 K/uL   RBC 4.63 3.87 - 5.11 Mil/uL   Hemoglobin 12.8 12.0 - 15.0 g/dL   HCT 38.1 36.0 - 46.0 %   MCV 82.3 78.0 - 100.0 fl   MCHC 33.5 30.0 - 36.0 g/dL   RDW 13.4 11.5 - 15.5 %   Platelets 282.0 150.0 - 400.0 K/uL   Neutrophils Relative % 53.0 43.0 - 77.0 %   Lymphocytes Relative 35.5 12.0 - 46.0 %   Monocytes Relative 7.9 3.0 - 12.0 %   Eosinophils Relative 2.8 0.0 - 5.0 %   Basophils Relative 0.8 0.0 - 3.0 %   Neutro Abs 3.3 1.4 - 7.7 K/uL   Lymphs Abs 2.2 0.7 - 4.0 K/uL   Monocytes Absolute 0.5 0.1 - 1.0 K/uL   Eosinophils Absolute 0.2 0.0 - 0.7 K/uL   Basophils Absolute 0.1 0.0 - 0.1 K/uL  Comprehensive metabolic panel  Result Value Ref Range   Sodium 140 135 - 145 mEq/L   Potassium 4.2 3.5 - 5.1 mEq/L   Chloride 106 96 - 112 mEq/L   CO2 29 19 - 32 mEq/L   Glucose, Bld 73 70 - 99 mg/dL   BUN 14 6 - 23 mg/dL   Creatinine, Ser 0.86 0.40 - 1.20 mg/dL   Total  Bilirubin 0.6 0.2 - 1.2 mg/dL   Alkaline Phosphatase 43 39 - 117 U/L   AST 11 0 - 37 U/L   ALT 8 0 - 35 U/L   Total Protein 6.8 6.0 - 8.3 g/dL   Albumin 4.3 3.5 - 5.2 g/dL   Calcium 9.5 8.4 - 10.5 mg/dL   GFR 77.98 >60.00 mL/min  Lipid panel  Result Value Ref Range   Cholesterol 178 0 - 200 mg/dL   Triglycerides 143.0 0.0 - 149.0 mg/dL   HDL 44.10 >39.00 mg/dL   VLDL 28.6 0.0 - 40.0 mg/dL   LDL Cholesterol 105 (H) 0 - 99 mg/dL   Total CHOL/HDL Ratio 4    NonHDL 133.65   TSH  Result Value Ref Range   TSH 1.69 0.35 - 4.50 uIU/mL  Vitamin B12  Result Value Ref Range   Vitamin B-12 275 211 - 911 pg/mL  VITAMIN D 25 Hydroxy (Vit-D Deficiency, Fractures)  Result Value Ref Range   VITD 23.14 (L) 30.00 - 100.00 ng/mL  Thyroid peroxidase antibody  Result Value Ref Range   Thyroperoxidase Ab SerPl-aCnc 7 <9 IU/mL  B-HCG Quant  Result Value Ref Range   Quantitative HCG 0.09 mIU/ml    Assessment and Plan:   Doing well. Pregnant and due this  summer. Will not find out sex of baby until delivery. Has a son that is 3. Still working full time.  She does believe she had a history of postpartum depression looking back at her previous delivery.  We had a long discussion regarding postpartum depression symptoms and treatment options.  She had breast-feeding issues with her son as well so we discussed plans for lactation specialist.  She did see sports medicine for evaluation of the skin change to her right elbow.  Dr. Paulla Fore did also think that it had to do with a mechanical issue.  I did let the patient know that if she would like further evaluation after her delivery that she can let me know.  Erin [name] was seen today for follow-up.  Diagnoses and all orders for this visit:  Fat necrosis of skin  Pregnancy, unspecified gestational age   . Reviewed expectations re: course of current medical issues. . Discussed self-management of symptoms. . Outlined signs and symptoms indicating need for more acute intervention. . Patient verbalized understanding and all questions were answered. Marland Kitchen Health Maintenance issues including appropriate healthy diet, exercise, and smoking avoidance were discussed with patient. . See orders for this visit as documented in the electronic medical record. . Patient received an After Visit Summary.  Briscoe Deutscher, DO [place], [facility] 08/06/2018

## 2018-08-05 ENCOUNTER — Ambulatory Visit (INDEPENDENT_AMBULATORY_CARE_PROVIDER_SITE_OTHER): Payer: BLUE CROSS/BLUE SHIELD | Admitting: Family Medicine

## 2018-08-05 ENCOUNTER — Encounter: Payer: Self-pay | Admitting: Family Medicine

## 2018-08-05 VITALS — BP 108/62 | HR 83 | Temp 98.1°F | Ht 68.0 in | Wt 183.8 lb

## 2018-08-05 DIAGNOSIS — M7989 Other specified soft tissue disorders: Secondary | ICD-10-CM | POA: Diagnosis not present

## 2018-08-05 DIAGNOSIS — Z3490 Encounter for supervision of normal pregnancy, unspecified, unspecified trimester: Secondary | ICD-10-CM

## 2018-08-05 NOTE — Patient Instructions (Signed)
I am excited for you to find out if you get to reuse the boy clothes!  Let me know right away if you experience post partum depression.  Also call if you'd like me to refer you to Dermatology.

## 2018-08-06 ENCOUNTER — Encounter: Payer: Self-pay | Admitting: Family Medicine

## 2018-08-06 DIAGNOSIS — Z3490 Encounter for supervision of normal pregnancy, unspecified, unspecified trimester: Secondary | ICD-10-CM | POA: Insufficient documentation

## 2018-08-10 DIAGNOSIS — Z362 Encounter for other antenatal screening follow-up: Secondary | ICD-10-CM | POA: Diagnosis not present

## 2018-08-10 DIAGNOSIS — Z3A23 23 weeks gestation of pregnancy: Secondary | ICD-10-CM | POA: Diagnosis not present

## 2018-09-03 DIAGNOSIS — J019 Acute sinusitis, unspecified: Secondary | ICD-10-CM | POA: Diagnosis not present

## 2018-09-09 DIAGNOSIS — Z361 Encounter for antenatal screening for raised alphafetoprotein level: Secondary | ICD-10-CM | POA: Diagnosis not present

## 2018-09-09 DIAGNOSIS — Z3480 Encounter for supervision of other normal pregnancy, unspecified trimester: Secondary | ICD-10-CM | POA: Diagnosis not present

## 2018-09-30 DIAGNOSIS — Z23 Encounter for immunization: Secondary | ICD-10-CM | POA: Diagnosis not present

## 2018-10-20 DIAGNOSIS — O9903 Anemia complicating the puerperium: Secondary | ICD-10-CM | POA: Diagnosis not present

## 2018-10-20 DIAGNOSIS — Z3A33 33 weeks gestation of pregnancy: Secondary | ICD-10-CM | POA: Diagnosis not present

## 2018-10-31 ENCOUNTER — Encounter: Payer: Self-pay | Admitting: Family Medicine

## 2018-11-02 ENCOUNTER — Ambulatory Visit (INDEPENDENT_AMBULATORY_CARE_PROVIDER_SITE_OTHER): Payer: BLUE CROSS/BLUE SHIELD | Admitting: Physician Assistant

## 2018-11-02 ENCOUNTER — Encounter: Payer: Self-pay | Admitting: Physician Assistant

## 2018-11-02 VITALS — Ht 68.0 in | Wt 186.0 lb

## 2018-11-02 DIAGNOSIS — W57XXXA Bitten or stung by nonvenomous insect and other nonvenomous arthropods, initial encounter: Secondary | ICD-10-CM

## 2018-11-02 DIAGNOSIS — S30860A Insect bite (nonvenomous) of lower back and pelvis, initial encounter: Secondary | ICD-10-CM

## 2018-11-02 MED ORDER — MUPIROCIN 2 % EX OINT: TOPICAL_OINTMENT | CUTANEOUS | 0 refills | Status: DC

## 2018-11-02 NOTE — Progress Notes (Signed)
Virtual Visit via Video   I connected with Erin [name] on 11/02/18 at  1:00 PM EDT by a video enabled telemedicine application and verified that I am speaking with the correct person using two identifiers. Location patient: Home Location provider: [facility], Office Persons participating in the virtual visit: CATE ORAVEC, Inda Coke PA-C, Anselmo Pickler, LPN   I discussed the limitations of evaluation and management by telemedicine and the availability of in person appointments. The patient expressed understanding and agreed to proceed.  I acted as a Education administrator for [employer], PA-C [employer], LPN  Subjective:   HPI:  Tick bite Pt was out over the weekend hiking, found a tick on her Monday afternoon center of mons pubis and removed the tick with a tweezer. The area is red and swollen and itching. She applied Neosporin and Calamine Lotion.  Denies discharge from area, unusual body aches, neck pain, unusual fatigue, fever. She states that the area is improving daily.  She is presently [redacted] weeks pregnant.  ROS: See pertinent positives and negatives per HPI.  Patient Active Problem List   Diagnosis Date Noted  . Currently pregnant 08/06/2018  . 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 Poor posture 02/03/2018    Social History   Tobacco Use  . Smoking status: Never Smoker  . Smokeless tobacco: Never Used  Substance Use Topics  . Alcohol use: Yes    Alcohol/week: 1.0 standard drinks    Types: 1 Glasses of wine per week    Current Outpatient Medications:  .  Prenatal Vit-Fe Fumarate-FA (PRENATAL 1+1 PO), Prenatal, Disp: , Rfl:  .  mupirocin ointment (BACTROBAN) 2 %, Apply to affected area 1-2 x daily, Disp: 22 g, Rfl: 0  No Known Allergies  Objective:   VITALS: Per patient if applicable, see vitals. GENERAL: Alert, appears well and in no acute distress. HEENT: Atraumatic, conjunctiva clear, no obvious  abnormalities on inspection of external nose and ears. NECK: Normal movements of the head and neck. CARDIOPULMONARY: No increased WOB. Speaking in clear sentences. I:E ratio WNL.  MS: Moves all visible extremities without noticeable abnormality. PSYCH: Pleasant and cooperative, well-groomed. Speech normal rate and rhythm. Affect is appropriate. Insight and judgement are appropriate. Attention is focused, linear, and appropriate.  NEURO: CN grossly intact. Oriented as arrived to appointment on time with no prompting. Moves both UE equally.  SKIN: Small pinpoint area in center of mons pubis with slight raised appearance; no target-like rash  Assessment and Plan:   Zuha was seen today for tick removal.  Diagnoses and all orders for this visit:  Insect bite of pelvic region, initial encounter No red flags on discussion with patient. Improving with time. Recommended OTC hydrocortisone to help with itching. Avoid neosporin, may use Bactroban prn. Red flags reviewed and worsening precautions advised.  Other orders -     mupirocin ointment (BACTROBAN) 2 %; Apply to affected area 1-2 x daily    . Reviewed expectations re: course of current medical issues. . Discussed self-management of symptoms. . Outlined signs and symptoms indicating need for more acute intervention. . Patient verbalized understanding and all questions were answered. Marland Kitchen Health Maintenance issues including appropriate healthy diet, exercise, and smoking avoidance were discussed with patient. . See orders for this visit as documented in the electronic medical record.  I discussed the assessment and treatment plan with the patient. The patient was provided an opportunity to ask questions and all  were answered. The patient agreed with the plan and demonstrated an understanding of the instructions.   The patient was advised to call back or seek an in-person evaluation if the symptoms worsen or if the condition fails to improve as  anticipated.   CMA or LPN served as scribe during this visit. History, Physical, and Plan performed by medical provider. The above documentation has been reviewed and is accurate and complete.  [place], [place] 11/02/2018

## 2018-11-11 DIAGNOSIS — Z3685 Encounter for antenatal screening for Streptococcus B: Secondary | ICD-10-CM | POA: Diagnosis not present

## 2018-11-16 DIAGNOSIS — Z3A37 37 weeks gestation of pregnancy: Secondary | ICD-10-CM | POA: Diagnosis not present

## 2018-11-16 DIAGNOSIS — Z3688 Encounter for antenatal screening for fetal macrosomia: Secondary | ICD-10-CM | POA: Diagnosis not present

## 2018-11-22 ENCOUNTER — Telehealth (HOSPITAL_COMMUNITY): Payer: Self-pay | Admitting: *Deleted

## 2018-11-22 ENCOUNTER — Encounter (HOSPITAL_COMMUNITY): Payer: Self-pay | Admitting: *Deleted

## 2018-11-22 NOTE — Telephone Encounter (Signed)
Preadmission screen  

## 2018-11-23 ENCOUNTER — Encounter (HOSPITAL_COMMUNITY): Payer: Self-pay | Admitting: *Deleted

## 2018-11-30 ENCOUNTER — Other Ambulatory Visit: Payer: Self-pay

## 2018-11-30 ENCOUNTER — Other Ambulatory Visit (HOSPITAL_COMMUNITY)
Admission: RE | Admit: 2018-11-30 | Discharge: 2018-11-30 | Disposition: A | Discharge status: Home / Self Care | Payer: BC Managed Care – PPO | Source: Ambulatory Visit | Attending: Obstetrics and Gynecology | Admitting: Obstetrics and Gynecology

## 2018-11-30 DIAGNOSIS — Z1159 Encounter for screening for other viral diseases: Secondary | ICD-10-CM | POA: Insufficient documentation

## 2018-11-30 LAB — SARS CORONAVIRUS 2 (TAT 6-24 HRS): SARS Coronavirus 2: NEGATIVE

## 2018-11-30 NOTE — MAU Note (Signed)
Covid swab collected. Pt tolerated well. Pt asymptomatic 

## 2018-12-01 ENCOUNTER — Inpatient Hospital Stay (HOSPITAL_COMMUNITY)
Admission: AD | Admit: 2018-12-01 | Discharge: 2018-12-03 | DRG: 807 | Disposition: A | Discharge status: Home / Self Care | Payer: BC Managed Care – PPO | Attending: Obstetrics and Gynecology | Admitting: Obstetrics and Gynecology

## 2018-12-01 ENCOUNTER — Encounter (HOSPITAL_COMMUNITY): Payer: Self-pay | Admitting: *Deleted

## 2018-12-01 DIAGNOSIS — O26893 Other specified pregnancy related conditions, third trimester: Secondary | ICD-10-CM | POA: Diagnosis not present

## 2018-12-01 DIAGNOSIS — O479 False labor, unspecified: Secondary | ICD-10-CM | POA: Diagnosis present

## 2018-12-01 DIAGNOSIS — Z3A39 39 weeks gestation of pregnancy: Secondary | ICD-10-CM | POA: Diagnosis not present

## 2018-12-01 DIAGNOSIS — Z3490 Encounter for supervision of normal pregnancy, unspecified, unspecified trimester: Secondary | ICD-10-CM

## 2018-12-01 DIAGNOSIS — Z2882 Immunization not carried out because of caregiver refusal: Secondary | ICD-10-CM | POA: Diagnosis not present

## 2018-12-01 LAB — CBC
HCT: 35.6 % — ABNORMAL LOW (ref 36.0–46.0)
Hemoglobin: 11.3 g/dL — ABNORMAL LOW (ref 12.0–15.0)
MCH: 24.6 pg — ABNORMAL LOW (ref 26.0–34.0)
MCHC: 31.7 g/dL (ref 30.0–36.0)
MCV: 77.6 fL — ABNORMAL LOW (ref 80.0–100.0)
Platelets: 340 10*3/uL (ref 150–400)
RBC: 4.59 MIL/uL (ref 3.87–5.11)
RDW: 14.7 % (ref 11.5–15.5)
WBC: 13.2 10*3/uL — ABNORMAL HIGH (ref 4.0–10.5)
nRBC: 0 % (ref 0.0–0.2)

## 2018-12-01 LAB — TYPE AND SCREEN
ABO/RH(D): B POS
Antibody Screen: NEGATIVE

## 2018-12-01 LAB — ABO/RH: ABO/RH(D): B POS

## 2018-12-01 MED ORDER — LACTATED RINGERS IV SOLN: INTRAVENOUS | Status: DC

## 2018-12-01 MED ORDER — ACETAMINOPHEN 325 MG PO TABS
650.0000 mg | ORAL_TABLET | ORAL | Status: DC | PRN
  Administered 2018-12-01 (×2): 650 mg via ORAL
  Filled 2018-12-01 (×2): qty 2

## 2018-12-01 MED ORDER — ACETAMINOPHEN 325 MG PO TABS: 650.0000 mg | ORAL_TABLET | ORAL | Status: DC | PRN

## 2018-12-01 MED ORDER — DIBUCAINE (PERIANAL) 1 % EX OINT: 1.0000 "application " | TOPICAL_OINTMENT | CUTANEOUS | Status: DC | PRN

## 2018-12-01 MED ORDER — OXYCODONE HCL 5 MG PO TABS: 5.0000 mg | ORAL_TABLET | ORAL | Status: DC | PRN

## 2018-12-01 MED ORDER — ONDANSETRON HCL 4 MG/2ML IJ SOLN: 4.0000 mg | INTRAMUSCULAR | Status: DC | PRN

## 2018-12-01 MED ORDER — OXYCODONE HCL 5 MG PO TABS
10.0000 mg | ORAL_TABLET | ORAL | Status: DC | PRN
  Administered 2018-12-01 (×3): 10 mg via ORAL
  Filled 2018-12-01 (×3): qty 2

## 2018-12-01 MED ORDER — LIDOCAINE HCL (PF) 1 % IJ SOLN
30.0000 mL | INTRAMUSCULAR | Status: AC | PRN
  Administered 2018-12-01: 30 mL via SUBCUTANEOUS
  Filled 2018-12-01: qty 30

## 2018-12-01 MED ORDER — OXYCODONE-ACETAMINOPHEN 5-325 MG PO TABS: 1.0000 | ORAL_TABLET | ORAL | Status: DC | PRN

## 2018-12-01 MED ORDER — WITCH HAZEL-GLYCERIN EX PADS: 1.0000 "application " | MEDICATED_PAD | CUTANEOUS | Status: DC | PRN

## 2018-12-01 MED ORDER — ZOLPIDEM TARTRATE 5 MG PO TABS: 5.0000 mg | ORAL_TABLET | Freq: Every evening | ORAL | Status: DC | PRN

## 2018-12-01 MED ORDER — OXYTOCIN BOLUS FROM INFUSION
500.0000 mL | Freq: Once | INTRAVENOUS | Status: AC
  Administered 2018-12-01: 07:00:00 500 mL via INTRAVENOUS

## 2018-12-01 MED ORDER — OXYTOCIN 40 UNITS IN NORMAL SALINE INFUSION - SIMPLE MED: 2.5000 [IU]/h | INTRAVENOUS | Status: DC

## 2018-12-01 MED ORDER — OXYCODONE-ACETAMINOPHEN 5-325 MG PO TABS: 2.0000 | ORAL_TABLET | ORAL | Status: DC | PRN

## 2018-12-01 MED ORDER — SOD CITRATE-CITRIC ACID 500-334 MG/5ML PO SOLN: 30.0000 mL | ORAL | Status: DC | PRN

## 2018-12-01 MED ORDER — BENZOCAINE-MENTHOL 20-0.5 % EX AERO
1.0000 "application " | INHALATION_SPRAY | CUTANEOUS | Status: DC | PRN
  Filled 2018-12-01: qty 56

## 2018-12-01 MED ORDER — SIMETHICONE 80 MG PO CHEW: 80.0000 mg | CHEWABLE_TABLET | ORAL | Status: DC | PRN

## 2018-12-01 MED ORDER — COCONUT OIL OIL: 1.0000 "application " | TOPICAL_OIL | Status: DC | PRN

## 2018-12-01 MED ORDER — TETANUS-DIPHTH-ACELL PERTUSSIS 5-2.5-18.5 LF-MCG/0.5 IM SUSP: 0.5000 mL | Freq: Once | INTRAMUSCULAR | Status: DC

## 2018-12-01 MED ORDER — OXYTOCIN 40 UNITS IN NORMAL SALINE INFUSION - SIMPLE MED
INTRAVENOUS | Status: AC
  Administered 2018-12-01: 500 mL via INTRAVENOUS
  Filled 2018-12-01: qty 1000

## 2018-12-01 MED ORDER — PRENATAL MULTIVITAMIN CH
1.0000 | ORAL_TABLET | Freq: Every day | ORAL | Status: DC
  Administered 2018-12-02: 1 via ORAL
  Filled 2018-12-01: qty 1

## 2018-12-01 MED ORDER — DIPHENHYDRAMINE HCL 25 MG PO CAPS: 25.0000 mg | ORAL_CAPSULE | Freq: Four times a day (QID) | ORAL | Status: DC | PRN

## 2018-12-01 MED ORDER — ONDANSETRON HCL 4 MG/2ML IJ SOLN: 4.0000 mg | Freq: Four times a day (QID) | INTRAMUSCULAR | Status: DC | PRN

## 2018-12-01 MED ORDER — ONDANSETRON HCL 4 MG PO TABS: 4.0000 mg | ORAL_TABLET | ORAL | Status: DC | PRN

## 2018-12-01 MED ORDER — LACTATED RINGERS IV SOLN: 500.0000 mL | INTRAVENOUS | Status: DC | PRN

## 2018-12-01 MED ORDER — SENNOSIDES-DOCUSATE SODIUM 8.6-50 MG PO TABS
2.0000 | ORAL_TABLET | ORAL | Status: DC
  Administered 2018-12-01 – 2018-12-02 (×2): 2 via ORAL
  Filled 2018-12-01 (×2): qty 2

## 2018-12-01 MED ORDER — IBUPROFEN 600 MG PO TABS
600.0000 mg | ORAL_TABLET | Freq: Four times a day (QID) | ORAL | Status: DC
  Administered 2018-12-01 – 2018-12-03 (×8): 600 mg via ORAL
  Filled 2018-12-01 (×8): qty 1

## 2018-12-01 NOTE — Progress Notes (Signed)
Delivery Note At 6:52 AM a viable female was delivered via Vaginal, Spontaneous (Presentation:LOA ;  ).  APGAR: , ; weight  .   Placenta status:intact , .  Cord: 3 vessels with the following complications: .  Cord pH:  Rapid progress. Crowning upon my arrival. Anesthesia:   Episiotomy:   Lacerations:  Second degree ML Lac repaired Suture Repair: 2.0 vicryl rapide Est. Blood Loss (mL):    Mom to postpartum.  Baby to Couplet care / Skin to Skin.  Shon Millet II 12/01/2018, 7:06 AM

## 2018-12-01 NOTE — MAU Note (Signed)
Pt here with c/o contractions and water broke in the lobby about 0630, clear fluid. Was 3 cm on last exam and GBS neg.

## 2018-12-01 NOTE — Progress Notes (Signed)
CSW received consult for hx of Post Partum Depression.  CSW met with MOB to offer support and complete assessment.    Upon entering the room CSW congratulated MOB and FOB on the birth of infant. CSW advised Mob of the HIPPA policy and MOB requested that FOB remain in the room. CSW understanding and advised MOB of the reason for CSW visiting. MOB reported that's he had PPD in 2017 after having first child. MOB expressed that this lasted for several months bust she never was treated for it. MOB denies taking any medications or being in therapy. MOB reported that during that time she spoke with positive supports such as spouse and friends. CSW asked MOB of she felt depression at this time and MOB declines. MOB was in bed smiling and eating lunch.  MOB reported that she has all needed items to care for infant and that she has felt fine. MOB expressed that she will take infant to [facility] for further care.   CSW provided education regarding the baby blues period vs. perinatal mood disorders, discussed treatment and gave resources for mental health follow up if concerns arise.  CSW recommends self-evaluation during the postpartum time period using the New Mom Checklist from Postpartum Progress and encouraged MOB to contact a medical professional if symptoms are noted at any time.   CSW provided review of Sudden Infant Death Syndrome (SIDS) precautions.   CSW identifies no further need for intervention and no barriers to discharge at this time.     Erin [name] S. Juanjose Mojica, MSW, LCSW-A [facility] [phone]  

## 2018-12-01 NOTE — H&P (Signed)
Erin [name] is a 38 y.o. female presenting for UC. Cx = 7 cm in MAU and SROM. OB History    Gravida  2   Para  1   Term      Preterm      AB      Living        SAB      TAB      Ectopic      Multiple      Live Births             Past Medical History:  Diagnosis Date  . Depression    PPD NO MEDS  . History of concussion 04/19/2017  . HPV in female   . Vaginal Pap smear, abnormal    Past Surgical History:  Procedure Laterality Date  . NO PAST SURGERIES     Family History: family history includes Anxiety disorder in her sister; Breast cancer in her maternal aunt; Breast cancer (age of onset: 77) in her paternal grandmother; Depression in her sister; Heart murmur in her brother; High Cholesterol in her mother; Hyperlipidemia in her mother; Hypertension in her mother; Macular degeneration in her maternal grandfather; Obesity in her father; Stroke in her paternal grandfather. Social History:  reports that she has never smoked. She has never used smokeless tobacco. She reports current alcohol use of about 1.0 standard drinks of alcohol per week. She reports that she does not use drugs.     Maternal Diabetes: No Genetic Screening: Normal Maternal Ultrasounds/Referrals: Normal Fetal Ultrasounds or other Referrals:  None Maternal Substance Abuse:  No Significant Maternal Medications:  None Significant Maternal Lab Results:  None Other Comments:  None  ROS History Dilation: 10 Effacement (%): 100 Station: Plus 2 Exam by:: Melany Guernsey, RN Blood pressure 125/73, pulse 61, temperature 98.3 F (36.8 C), temperature source Oral, resp. rate 20, last menstrual period 03/02/2018. Exam Physical Exam  Cardiovascular: Normal rate.  Respiratory: Effort normal.  GI: Soft.    Prenatal labs: ABO, Rh: B/Positive/-- (11/21 0000) Antibody: Negative (11/21 0000) Rubella: Immune (11/21 0000) RPR: Nonreactive (11/21 0000)  HBsAg: Negative (11/21 0000)  HIV: Non-reactive  (11/21 0000)  GBS:     Assessment/Plan: 38 yo G2P1 @ 10 1/7 weeks in active labor   Shon Millet II 12/01/2018, 7:04 AM

## 2018-12-01 NOTE — Progress Notes (Addendum)
Report called to Dr. Gaetano Net. Orders received for admission. Provider will head in now.

## 2018-12-01 NOTE — Lactation Note (Signed)
This note was copied from a baby's chart. Lactation Consultation Note  Patient Name: Erin [name] [id number] Date: 12/01/2018 Reason for consult: Initial assessment;Term  P2 mother whose infant is now 61 hours old.  This is a term infant.  Mother breast fed her first child (now 40 years old) for 11 months.  He had a tongue tie with revision.  Mother is not sure if this baby has the same diagnosis.  I suggested that we observe feedings and if there is a concern the pediatrician can evaluate tomorrow morning.   Baby was asleep in mother's arms when I arrived.  Encouraged to feed 8-12 times/24 hours or sooner if baby shows feeding cues.  Reviewed cues. Hand expression discussed and mother is familiar with this. Colostrum container provided for any EBM mother may obtain with hand expression.  Milk storage times reviewed and finger feeding demonstrated.  Mother will return to work in 31 weeks. She has a DEBP for home use and a manual pump at bedside.  Father present and supportive.  Encouraged to call RN/LC for latch assistance as needed.  Mom made aware of O/P services, breastfeeding support groups, community resources, and our phone # for post-discharge questions.      Maternal Data Formula Feeding for Exclusion: No Has patient been taught Hand Expression?: Yes Does the patient have breastfeeding experience prior to this delivery?: Yes  Feeding Feeding Type: Breast Milk  LATCH Score                   Interventions    Lactation Tools Discussed/Used WIC Program: No   Consult Status Consult Status: Follow-up Date: 12/02/18 Follow-up type: In-patient    Little Ishikawa 12/01/2018, 4:15 PM

## 2018-12-02 ENCOUNTER — Inpatient Hospital Stay (HOSPITAL_COMMUNITY): Payer: BC Managed Care – PPO

## 2018-12-02 LAB — CBC
HCT: 31.2 % — ABNORMAL LOW (ref 36.0–46.0)
Hemoglobin: 9.7 g/dL — ABNORMAL LOW (ref 12.0–15.0)
MCH: 24.3 pg — ABNORMAL LOW (ref 26.0–34.0)
MCHC: 31.1 g/dL (ref 30.0–36.0)
MCV: 78.2 fL — ABNORMAL LOW (ref 80.0–100.0)
Platelets: 269 10*3/uL (ref 150–400)
RBC: 3.99 MIL/uL (ref 3.87–5.11)
RDW: 15 % (ref 11.5–15.5)
WBC: 12.6 10*3/uL — ABNORMAL HIGH (ref 4.0–10.5)
nRBC: 0 % (ref 0.0–0.2)

## 2018-12-02 LAB — HIV ANTIBODY (ROUTINE TESTING W REFLEX): HIV Screen 4th Generation wRfx: NONREACTIVE

## 2018-12-02 LAB — RPR: RPR Ser Ql: NONREACTIVE

## 2018-12-02 NOTE — Progress Notes (Signed)
Post Partum Day 1 Subjective: no complaints  Objective: Blood pressure 111/75, pulse 65, temperature 98.7 F (37.1 C), temperature source Oral, resp. rate 16, height 5\' 8"  (1.727 m), weight 88.5 kg, last menstrual period 03/02/2018, SpO2 100 %, unknown if currently breastfeeding.  Physical Exam:  General: alert and cooperative Lochia: appropriate Uterine Fundus: firm Incision: n/a DVT Evaluation: No evidence of DVT seen on physical exam.  Recent Labs    12/01/18 0647 12/02/18 0706  HGB 11.3* 9.7*  HCT 35.6* 31.2*    Assessment/Plan: Plan for discharge tomorrow   LOS: 1 day   Erin [name] 12/02/2018, 8:19 AM

## 2018-12-02 NOTE — Lactation Note (Signed)
This note was copied from a baby's chart. Lactation Consultation Note  Patient Name: Erin [name] [id number] Date: 12/02/2018 Reason for consult: Difficult latch;Term;Nipple pain/trauma  P2 mother whose infant is now 7 hours old.  Mother breast fed her first child (now 40 years old) for 11 months.  Her first child had a tongue tie with revision.    Mother had just recently finished feeding baby when I arrived.  When questioned about how breast feeding has been progressing mother stated, "My nipples are hurting."  Mother's breasts are soft and non tender and nipples are everted and intact.  However, it appears that baby has been latching shallow at the breast.  Mother's nipples are reddened and irritated.  She had a small bruise under the right nipple.    Baby was awake and showing more feeding cues.  Offered to assist with latching and mother accepted.  Reviewed breast feeding basics with mother.  Mother was able to hand express colostrum drops of colostrum which I finger fed back to baby.  Baby  appears to have a shorter frenulum, however, she is able to protrude tongue over lower gum ridge and cup my gloved finger well.  Mother's nipples are rounded upon inspection.  I suggested she continue to work on latching and alert RN if she has difficulty latching.  Assisted baby to latch in the cross cradle hold to the right breast after a couple attempts.  Mother observed a deeper latch than what she has been seeing and denied pain with latching.  Demonstrated breast compressions and mother was able to return demonstrate.  Baby was not very actively feeding and required gentle stimulation to continue.  I observed her feeding on/off for 10 minutes.  I explained to mother that she is transferring colostrum and did not appear to be too hungry when I was assisting.  The voids/stools have been very adequate and baby is able to be quiet and content after feeding.  Emotional support provided.  Mother reassured to  continue latching and calling for assistance as needed.  Instructed mother to rub EBM into nipple/areola and I provided comfort gels for relief.  Mother appreciative of assistance.  Father present and supportive.      Maternal Data Formula Feeding for Exclusion: No Has patient been taught Hand Expression?: Yes Does the patient have breastfeeding experience prior to this delivery?: Yes  Feeding Feeding Type: Breast Fed  LATCH Score Latch: Repeated attempts needed to sustain latch, nipple held in mouth throughout feeding, stimulation needed to elicit sucking reflex.  Audible Swallowing: A few with stimulation  Type of Nipple: Everted at rest and after stimulation  Comfort (Breast/Nipple): Filling, red/small blisters or bruises, mild/mod discomfort  Hold (Positioning): Assistance needed to correctly position infant at breast and maintain latch.  LATCH Score: 6  Interventions Interventions: Breast feeding basics reviewed;Assisted with latch;Skin to skin;Breast massage;Hand express;Breast compression;Comfort gels;Coconut oil;Position options;Support pillows  Lactation Tools Discussed/Used WIC Program: No   Consult Status Consult Status: Follow-up Date: 12/03/18 Follow-up type: In-patient    Little Ishikawa 12/02/2018, 3:23 PM

## 2018-12-03 ENCOUNTER — Ambulatory Visit: Payer: Self-pay

## 2018-12-03 NOTE — Lactation Note (Signed)
This note was copied from a baby's chart. Lactation Consultation Note  Patient Name: Erin [name] Date: 12/03/2018  baby and  Mom D/C prior to [facility] visit this am at 1200.  8 % weight loss , reading the NO - progress note - supplementing has been ordered  With breastfeeding. And follow up for 48 hours at [facility] office.  LC reviewed doc flow sheets and the weight loss correlate with the  The voids and stools in the baby's life. Serum Bili at 24 hours - 4.0.  Breast feeding range - 10 -30 mins and a few 5 mins , Latch score range - 9-6-7-9 .      Maternal Data    Feeding    LATCH Score                   Interventions    Lactation Tools Discussed/Used     Consult Status      [name] 12/03/2018, 11:59 AM

## 2018-12-03 NOTE — Discharge Summary (Signed)
Obstetric Discharge Summary Reason for Admission: onset of labor Prenatal Procedures: ultrasound Intrapartum Procedures: spontaneous vaginal delivery Postpartum Procedures: none Complications-Operative and Postpartum: 2nd degree perineal laceration Hemoglobin  Date Value Ref Range Status  12/02/2018 9.7 (L) 12.0 - 15.0 g/dL Final   HCT  Date Value Ref Range Status  12/02/2018 31.2 (L) 36.0 - 46.0 % Final    Physical Exam:  General: alert and cooperative Lochia: appropriate Uterine Fundus: firm Incision: n/a DVT Evaluation: No evidence of DVT seen on physical exam.  Discharge Diagnoses: Term Pregnancy-delivered  Discharge Information: Date: 12/03/2018 Activity: pelvic rest Diet: routine Medications: PNV and Ibuprofen Condition: stable Instructions: refer to practice specific booklet Discharge to: home   Newborn Data: Live born female  Birth Weight: 8 lb 0.6 oz (3645 g) APGAR: 9, 9  Newborn Delivery   Birth date/time: 12/01/2020 06:52:00 Delivery type: Vaginal, Spontaneous      Home with mother.  Marylynn Pearson 12/03/2018, 7:24 AM

## 2018-12-13 ENCOUNTER — Ambulatory Visit: Payer: Self-pay

## 2018-12-13 NOTE — Lactation Note (Signed)
This note was copied from a baby's chart. Lactation Consultation Note  Patient Name: Erin [name] [id number] Date: 12/13/2018     12/13/2018  Name: Erin [name] MRN: [id number] Date of Birth: 12/01/2020 Gestational Age: Gestational Age: [redacted]w[redacted]d Birth Weight: 128.6 oz Weight today:    8 pounds 5.4 ounces (3782 grams) with clean newborn diaper   Lorre Nick presents today with mom and dad for feeding assessment. Infant post tongue and lip releases on 7/2 by Dr Carlos Levering. Infant has follow up appt today and all felt to be doing well.   Infant has gained 432 grams in the last 10 days with an average daily weight gain of 43 grams a day. Infant is 137 grams above birth weight.   Infant is self awakening for some feedings and parents are awakening for others. Infant is BF better at some feedings than others. Infant is being supplemented with a bottle of EBM or Formula after each feeding with 1/2-3.5 ounces.  Infant latched to the left breast in the cross cradle hold. Assisted mom with positioning and deepening latch with feeding. Infant with disorganized suckle at the beginning of the feeding. Mom's nipple compressed post feeding. Infant fed better on the right breast and was more active Feeding finished with a bottle.   Mom's nipples have been scabbed. She is using Coconut oil and comfort gels. Nipples healing with some small scabs noted.   Discussed limiting feeding time as needed if infant not actively feeding at the breast and offer her the bottle until she is transferring more consistently at the breast. Discussed paced bottle feeding with parents.   Discussed normal progression of healing for the tongue and lip releases and what to expect with feedings as she heals. Discussed that infant may have some good feedings. Mom is feeling exhausted. Discussed rest and self care. Discussed that it is ok to pump and bottle feed at some feedings to allow mom to rest and to limit long feedings at the  breast to allow mom and infant more rest until infant more consistent at the breast.   Infant to follow up with Lactation as needed at parents request. [facility] 7[facility]. Mom aware of the Virtual BF Support Groups. Mom to call with questions/concerns as needed.      General Information: Mother's reason for visit: Feeding assessment, post tongue and lip releases 7/2 by Dr. Carlos Levering Consult: Initial Lactation consultant: Nonah Mattes RN,IBCLC Breastfeeding experience: BF inconsistently, some very long feedings Maternal medical conditions: History post partum depression, Other(PPD with 1st infant) Maternal medications: Pre-natal vitamin, Iron  Breastfeeding History: Frequency of breast feeding: every 3-4 hours Duration of feeding: 10-60 minutes  Supplementation: Supplement method: bottle(Avent Size 1 nipple) Brand: Similac Formula volume: 3-3.5 ounces Formula frequency: 50% of feedings   Breast milk volume: 3-3.5 ounces Breast milk frequency: 50% of feedings   Pump type: Spectra Pump frequency: 5-6 x a day, 15 minutes per side, does best with single pumping-Suggested a hands free bra Pump volume: 1-3.5 ounces  Infant Output Assessment: Voids per 24 hours: 8 Urine color: Clear yellow Stools per 24 hours: 6-8 Stool color: Yellow  Breast Assessment: Breast: Soft, Compressible Nipple: Erect, Scabs Pain level: 4 Pain interventions: Bra, Coconut oil, Comfort gels, Expressed breast milk, Breast pump  Feeding Assessment: Infant oral assessment: Variance Infant oral assessment comment: Infant wiht swelling to upper lip. Granulation tissue noted to upper lip. infant with red ring around upper lip after feeding, lips  flange well on the breast. infant with diamond shaped granulation tissue under tongue. infant with very strong suckle. Infant with tongue thrusting and disorganized suckle on breast and on gloved finger. Positioning: Cross cradle(left breast, 20  minutes) Latch: 1 - Repeated attempts needed to sustain latch, nipple held in mouth throughout feeding, stimulation needed to elicit sucking reflex. Audible swallowing: 1 - A few with stimulation Type of nipple: 2 - Everted at rest and after stimulation Comfort: 1 - Filling, red/small blisters or bruises, mild/mod discomfort Hold: 1 - Assistance needed to correctly position infant at breast and maintain latch LATCH score: 6 Latch assessment: Deep Lips flanged: Yes Suck assessment: Displays both   Pre-feed weight: 3782 grams Post feed weight: 3788 grams Amount transferred: 6 ml Amount supplemented: 0  Additional Feeding Assessment: Infant oral assessment: Variance Infant oral assessment comment: see above Positioning: Cross cradle(right breast, 15 minutes) Latch: 2 - Grasps breast easily, tongue down, lips flanged, rhythmical sucking. Audible swallowing: 2 - Spontaneous and intermittent Type of nipple: 2 - Everted at rest and after stimulation Comfort: 1 - Filling, red/small blisters or bruises, mild/mod discomfort Hold: 1 - Assistance needed to correctly position infant at breast and maintain latch LATCH score: 8 Latch assessment: Deep Lips flanged: Yes Suck assessment: Displays both   Pre-feed weight: 3788 grams Post feed weight: 3820 grams Amount transferred: 32 ml Amount supplemented: 60 ml EBM via bottle  Totals: Total amount transferred: 32 ml Total supplement given: 60 ml EBM via bottle Total amount pumped post feed: did not pump   Plan:  1. Breast feed infant at the breast with feeding cues as mom and infant want. Try to get her to breast at least 4 x a day. Limit breast feeding to 20 minutes if infant is sleepy at the breast and offer her a bottle 2. Keep infant awake at the breast as needed 3. Massage/compress breast with feeding as needed 4. Empty the first breast before offering the second breast.  5. Offer infant a bottle of pumped breast milk or formula after  breast feeding if she is still cueing to feed 6. Feed infant using the Avent Bottle as you have been using paced bottle feeding (video on [email or web address]) 7. Offer infant 1/2 ounce in a bottle if she is frustrated at the breast and then offer the breast 8. Infant needs about 69-93 ml (2.5-3 ounces) for 8 feedings a day or 555-740 ml (19-25 ounces) in 24 hours. Infant may take more or less per feeding depending on how often she feeds. Feed infant until satisfied.  9. Continue pumping 6-8 x a day for 15-20 minutes after breast feeding or in place of breast feeding to protect milk supply. Use a hands free bra with pumping and massage/compress breasts with pumping.  10. Continue stretches per Dr. Carlos Levering 11. Suck training exercises 5-6 x a day for 1-2 minutes per exercise for 2-3 weeks 12. Roylene Reason, Cranio Sacro Therapist may be helpful for her tight muscles [phone] 13. Keep up the good work 59. Please call with any questions/concerns as needed [phone] 15. Thank you for allowing me to assist you today 16. Follow up with Lactation as needed Donn Pierini RN, [employer]  Debby Freiberg Elinda Bunten 12/13/2018, 3:40 PM

## 2018-12-27 ENCOUNTER — Other Ambulatory Visit: Payer: Self-pay

## 2018-12-27 ENCOUNTER — Encounter: Payer: Self-pay | Admitting: Physician Assistant

## 2018-12-27 ENCOUNTER — Ambulatory Visit (INDEPENDENT_AMBULATORY_CARE_PROVIDER_SITE_OTHER): Payer: BC Managed Care – PPO | Admitting: Physician Assistant

## 2018-12-27 ENCOUNTER — Ambulatory Visit: Payer: Self-pay

## 2018-12-27 ENCOUNTER — Ambulatory Visit: Payer: Self-pay | Admitting: *Deleted

## 2018-12-27 VITALS — BP 100/70 | HR 68 | Temp 98.3°F | Resp 14 | Ht 68.0 in | Wt 171.0 lb

## 2018-12-27 DIAGNOSIS — S80862A Insect bite (nonvenomous), left lower leg, initial encounter: Secondary | ICD-10-CM | POA: Diagnosis not present

## 2018-12-27 DIAGNOSIS — W57XXXA Bitten or stung by nonvenomous insect and other nonvenomous arthropods, initial encounter: Secondary | ICD-10-CM | POA: Diagnosis not present

## 2018-12-27 MED ORDER — CEPHALEXIN 500 MG PO CAPS: 500.0000 mg | ORAL_CAPSULE | Freq: Two times a day (BID) | ORAL | 0 refills | Status: AC

## 2018-12-27 NOTE — Patient Instructions (Signed)
Please keep the leg iced. Elevation will help with swelling. Can take Loratadine (Claritin) to help with swelling.  Do not use Benadryl as this is not safe with breastfeeding.  IF you note any worsening redness after tonight or worsening tenderness start the antibiotic, taking as directed.  If anything worsening after that point, please come see myself or Dr. Juleen China ASAP.

## 2018-12-27 NOTE — Lactation Note (Signed)
This note was copied from a baby's chart. Lactation Consultation Note  Patient Name: Erin [name] WCHEN'I Date: 12/27/2018     12/27/2018  Name: Erin [name] MRN: [id number] Date of Birth: 12/01/2020 Gestational Age: Gestational Age: [redacted]w[redacted]d Birth Weight: 128.6 oz Weight today:    9 pounds 6 ounces (4252 grams) with clean size 24 diaper  93 week old infant presents today with mom and dad for follow up feeding assessment. Infant post tongue and lip releases on 7/2 by Dr. Carlos Levering.   Mom reports BF going ok but can be inconsistent. Infant is being awakened to feed for most feedings. Infant will be awake for a while after feedings and then infant is being awakened at around 3 hours to feed. Parents and infant are getting very little sleep. Reviewed allowing infant to awaken to feed on her own with goal of at least 7 feedings a day. She is feeding at the breast and then supplementing with the bottle after most feedings. She will take 1/2- 2 ounces post breast feedings.   Mom is pumping 4-5 x a day and getting 2-4 ounces per pumping. Mom does not feel infant empties the breasts for most feedings. Mom pumps one breast at a time.   Mom is pumping about every 3-4 feeding and dad is bottle feeding infant. Infant is using some formula. Infant getting 3/4 breast milk 1/4 formula. Infant is using an Avent nipple and parents are using the 0 and 1 nipples. Infant drools on the level 1 nipple. Reviewed changing back to Level 0 nipple  Infant is fussy after most feedings for 1-2 hours post feedings. Infant is kept upright after feedings prior to being laid down. Infant has been started on Probiotics 2 days ago. Infant has been seen by Roylene Reason and will follow up this week. Parents using Gripe water and gas gtts also. They reports infant not very gassy but uncomfortable. Infant spits with some burps.   Infant with healed tongue and lip. Infant with tight upper lip, infant with blanching and and  rid ring around upper lip post feeding. Lip is flanged on the breast. Infant with narrow gape on breast, parents do chin tug once latched to the breast. Infant with better tongue mobility on gloved finger. Infant gaggy on gloved finger. Parents are performing suck training as prescribed. Nipple slightly compressed post feeding.   Infant was fed about 1.5 hours prior to appt. Infant latched on the left breast and fed for about 10 minutes. Infant was then latched to the right breast and pulled on and off the breast  Infant to follow up with Roylene Reason this week. [facility] at 88 months of age. Infant to follow up with Lactation as needed.   General Information: Mother's reason for visit: Follow up feeding assessment Consult: Follow-up Lactation consultant: Nonah Mattes RN,IBCLC Breastfeeding experience: BF ok, Supplementing after most breast feeds Maternal medical conditions: History post partum depression(with first infant) Maternal medications: Pre-natal vitamin  Breastfeeding History: Frequency of breast feeding: every 3 hours. infant BF and supplementing Duration of feeding: 15 minutes  Supplementation: Supplement method: bottle(Avent Size 0 and 1 nipples) Brand: Similac Formula volume: 1/2-2 ounces Formula frequency: 1/4 of her supplement   Breast milk volume: 1/2-2 ounces Breast milk frequency: every 3 hours   Pump type: Spectra Pump frequency: 4-5 x a day Pump volume: 2-5 ounces  Infant Output Assessment: Voids per 24 hours: 8 Urine color: Clear yellow Stools per  24 hours: 4+ Stool color: Yellow  Breast Assessment: Breast: Soft, Compressible Nipple: Erect(slighty compressed post feeding) Pain level: 2(with feeding) Pain interventions: Bra, Breast pump  Feeding Assessment: Infant oral assessment: Variance Infant oral assessment comment: see note Positioning: Cross cradle(left breast, 10 minutes) Latch: 2 - Grasps breast easily, tongue down,  lips flanged, rhythmical sucking. Audible swallowing: 2 - Spontaneous and intermittent Type of nipple: 2 - Everted at rest and after stimulation Comfort: 1 - Filling, red/small blisters or bruises, mild/mod discomfort Hold: 2 - No assistance needed to correctly position infant at breast LATCH score: 9 Latch assessment: Deep Lips flanged: Yes Suck assessment: Displays both   Pre-feed weight: 4252 grams Post feed weight: 4286 grams Amount transferred: 34 ml Amount supplemented: 0  Additional Feeding Assessment: Infant oral assessment: Variance Infant oral assessment comment: see note Positioning: Cross cradle(right breast, 15 minutes) Latch: 2 - Grasps breast easily, tongue down, lips flanged, rhythmical sucking. Audible swallowing: 2 - Spontaneous and intermittent Type of nipple: 2 - Everted at rest and after stimulation Comfort: 1 - Filling, red/small blisters or bruises, mild/mod discomfort Hold: 2 - No assistance needed to correctly position infant at breast LATCH score: 9 Latch assessment: Deep Lips flanged: Yes Suck assessment: Displays both   Pre-feed weight: 4286 grams Post feed weight: 4312 grams Amount transferred: 26 ml-went back on and fed again after getting dressed    Totals: Total amount transferred: 60 ml + Total supplement given: 0 Total amount pumped post feed: did not pump   Plan: 1. Breast feed infant at the breast with feeding cues with goal of getting at least 7 feedings a day 2. Keep infant awake at the breast as needed 3. Massage/compress breast with feeding as needed 4. Empty the first breast before offering the second breast.  5. Offer infant a bottle of pumped breast milk or formula after breast feeding if she is still cueing to feed 6. Feed infant using the Avent Bottle as you have been using paced bottle feeding (video on [email or web address]) 7. Offer infant 1/2 ounce in a bottle if she is frustrated at the breast and then offer the breast 8. Infant  needs about 79-105 ml (2.5-3.5 ounces) for 8 feedings a day or 630-840 ml (21-28 ounces) in 24 hours. Infant may take more or less per feeding depending on how often she feeds. Feed infant until satisfied.  9. Continue pumping 5-6 x a day for 15-20 minutes after breast feeding or in place of breast feeding to protect milk supply. Use a hands free bra with pumping and massage/compress breasts with pumping.  10. Try an Asymmetrical Latch for latching infant 11. Continue stretches per Dr. Carlos Levering 12. Suck training exercises 5-6 x a day for 1-2 minutes per exercise for 2-3 weeks 13. Roylene Reason, Cranio Sacro Therapist may be helpful for her tight muscles [phone] 14. Keep up the good work 12. Please call with any questions/concerns as needed [phone] 16. Thank you for allowing me to assist you today 17. Follow up with Lactation as needed   [facility] RN, IBCLC                                                    Debby Freiberg Edouard Gikas 12/27/2018, 10:10 AM

## 2018-12-27 NOTE — Telephone Encounter (Signed)
See note, Madelyn transferred the call to a front office personnel at [place].

## 2018-12-27 NOTE — Progress Notes (Signed)
Patient presents to clinic today c/o bite with redness of her left posterior calf. Notes waking up yesterday morning noting the bite with small area of redness. Notes the area was itchy but non-painful. Notes that the area has gotten larger and more pruritic since then. Denies any occurrence of pain. Denies fever, chills, malaise or fatigue. Has not applied anything to the area or taken anything for symptoms. Recently gave birth 3 weeks ago and is breastfeeding.  Past Medical History:  Diagnosis Date  . Depression    PPD NO MEDS  . History of concussion 04/19/2017  . HPV in female   . Vaginal Pap smear, abnormal     Current Outpatient Medications on File Prior to Visit  Medication Sig Dispense Refill  . Ferrous Sulfate (IRON PO) Take by mouth daily.    . Prenatal Vit-Fe Fumarate-FA (PRENATAL 1+1 PO) Prenatal     No current facility-administered medications on file prior to visit.     No Known Allergies  Family History  Problem Relation Age of Onset  . High Cholesterol Mother   . Hypertension Mother   . Hyperlipidemia Mother   . Obesity Father   . Anxiety disorder Sister   . Depression Sister   . Heart murmur Brother   . Macular degeneration Maternal Grandfather   . Breast cancer Paternal Grandmother 12  . Stroke Paternal Grandfather   . Breast cancer Maternal Aunt     Social History   Socioeconomic History  . Marital status: Married    Spouse name: Not on file  . Number of children: Not on file  . Years of education: Not on file  . Highest education level: Not on file  Occupational History  . Not on file  Social Needs  . Financial resource strain: Not hard at all  . Food insecurity    Worry: Never true    Inability: Never true  . Transportation needs    Medical: No    Non-medical: Not on file  Tobacco Use  . Smoking status: Never Smoker  . Smokeless tobacco: Never Used  Substance and Sexual Activity  . Alcohol use: Yes    Alcohol/week: 1.0 standard  drinks    Types: 1 Glasses of wine per week  . Drug use: Never  . Sexual activity: Not on file  Lifestyle  . Physical activity    Days per week: Not on file    Minutes per session: Not on file  . Stress: To some extent  Relationships  . Social Herbalist on phone: Not on file    Gets together: Not on file    Attends religious service: Not on file    Active member of club or organization: Not on file    Attends meetings of clubs or organizations: Not on file    Relationship status: Not on file  Other Topics Concern  . Not on file  Social History Narrative  . Not on file   Review of Systems - See HPI.  All other ROS are negative.  Wt 171 lb (77.6 kg)   BMI 26.00 kg/m   Physical Exam Vitals signs reviewed.  Constitutional:      Appearance: Normal appearance.  Neck:     Musculoskeletal: Normal range of motion.  Cardiovascular:     Rate and Rhythm: Normal rate and regular rhythm.     Pulses: Normal pulses.     Heart sounds: Normal heart sounds.  Pulmonary:     Effort:  Pulmonary effort is normal.  Skin:      Neurological:     Mental Status: She is alert.     Recent Results (from the past 2160 hour(s))  SARS Coronavirus 2 (Performed in [place] hospital lab)     Status: None   Collection Time: 11/30/18  7:26 AM   Specimen: Nasal Swab  Result Value Ref Range   SARS Coronavirus 2 NEGATIVE NEGATIVE    Comment: (NOTE) SARS-CoV-2 target nucleic acids are NOT DETECTED. The SARS-CoV-2 RNA is generally detectable in upper and lower respiratory specimens during the acute phase of infection. Negative results do not preclude SARS-CoV-2 infection, do not rule out co-infections with other pathogens, and should not be used as the sole basis for treatment or other patient management decisions. Negative results must be combined with clinical observations, patient history, and epidemiological information. The expected result is Negative. Fact Sheet for Patients:  TrashEliminator.se Fact Sheet for Healthcare Providers: WhoisBlogging.ch This test is not yet approved or cleared by the [place] FDA and  has been authorized for detection and/or diagnosis of SARS-CoV-2 by FDA under an Emergency Use Authorization (EUA). This EUA will remain  in effect (meaning this test can be used) for the duration of the COVID-19 declaration under Section 56 4(b)(1) of the Act, 21 U.S.C. section 360bbb-3(b)(1), unless the authorization is terminated or revoked sooner. Performed at [facility], [place] [address] [place], [place] [address]   CBC     Status: Abnormal   Collection Time: 12/01/18  6:47 AM  Result Value Ref Range   WBC 13.2 (H) 4.0 - 10.5 K/uL   RBC 4.59 3.87 - 5.11 MIL/uL   Hemoglobin 11.3 (L) 12.0 - 15.0 g/dL   HCT 35.6 (L) 36.0 - 46.0 %   MCV 77.6 (L) 80.0 - 100.0 fL   MCH 24.6 (L) 26.0 - 34.0 pg   MCHC 31.7 30.0 - 36.0 g/dL   RDW 14.7 11.5 - 15.5 %   Platelets 340 150 - 400 K/uL   nRBC 0.0 0.0 - 0.2 %    Comment: Performed at [facility], [place] [address] [place], [place] [address]  Type and screen [place]     Status: None   Collection Time: 12/01/18  6:47 AM  Result Value Ref Range   ABO/RH(D) B POS    Antibody Screen NEG    Sample Expiration      12/04/2018,2359 Performed at [facility], [place] [address] [place], [place] [address]   HIV Antibody (routine testing w rflx)     Status: None   Collection Time: 12/01/18  6:47 AM  Result Value Ref Range   HIV Screen 4th Generation wRfx Non Reactive Non Reactive    Comment: (NOTE) Performed At: [facility] [place], [place] [id number] Rush Farmer MD YI:[id number]   ABO/Rh     Status: None   Collection Time: 12/01/18  6:47 AM  Result Value Ref Range   ABO/RH(D)      B POS Performed at [facility], [address], [place],  [address]   RPR     Status: None    Collection Time: 12/01/18  1:15 PM  Result Value Ref Range   RPR Ser Ql Non Reactive Non Reactive    Comment: (NOTE) Performed At: [facility] [address] [place], [place] [id number] Rush Farmer MD OF:[id number]   CBC     Status: Abnormal  Collection Time: 12/02/18  7:06 AM  Result Value Ref Range   WBC 12.6 (H) 4.0 - 10.5 K/uL   RBC 3.99 3.87 - 5.11 MIL/uL   Hemoglobin 9.7 (L) 12.0 - 15.0 g/dL   HCT 31.2 (L) 36.0 - 46.0 %   MCV 78.2 (L) 80.0 - 100.0 fL   MCH 24.3 (L) 26.0 - 34.0 pg   MCHC 31.1 30.0 - 36.0 g/dL   RDW 15.0 11.5 - 15.5 %   Platelets 269 150 - 400 K/uL   nRBC 0.0 0.0 - 0.2 %    Comment: Performed at [facility], [place] [address] [place], [place] [address]   Assessment/Plan: 1. Insect bite of left lower leg, initial encounter Histamine response worsening over the past 24 hours. Only the area just at the bite with any tautness or potential induration and warmth. Could potentially be start of mild cellulitis. Recommend ice, elevation, Claritin (avoiding benadryl due to breast feeding). Rx Keflex to start if symptoms are not calming down with antihistamine. Strict return precautions reviewed with patient who voiced understanding and agreement with plan.    Leeanne Rio, PA-C

## 2018-12-27 NOTE — Telephone Encounter (Signed)
Insect bite- patient is not sure what has bitten her- she woke up yesterday morning an the bite was there on the leg. Patient does not have fever- but the redness around the area has increased and she reports it is warm to touch. It does itch slightly and is not painful. Call to office for appointment  Reason for Disposition . [1] Red or very tender (to touch) area AND [2] started over 24 hours after the bite  Answer Assessment - Initial Assessment Questions 1. TYPE of INSECT: "What type of insect was it?"      Not sure- patient woke up with the bite 2. ONSET: "When did you get bitten?"      Yesterday morning 3. LOCATION: "Where is the insect bite located?"      Left leg on calve 4. REDNESS: "Is the area red or pink?" If so, ask "What size is area of redness?" (inches or cm). "When did the redness start?"     Area- center circle is deep pink- outer circle is light pink, size- center- 1 inch- outer- 3 inches, redness started yesterday- has gotten much larger within 24 hours 5. PAIN: "Is there any pain?" If so, ask: "How bad is it?"  (Scale 1-10; or mild, moderate, severe)     Not really 6. ITCHING: "Does it itch?" If so, ask: "How bad is the itch?"    - MILD: doesn't interfere with normal activities   - MODERATE-SEVERE: interferes with work, school, sleep, or other activities      Yes- only when touches-mild 7. SWELLING: "How big is the swelling?" (inches, cm, or compare to coins)     Appears somewhat raised- hard area in center 8. OTHER SYMPTOMS: "Do you have any other symptoms?"  (e.g., difficulty breathing, hives)     no 9. PREGNANCY: "Is there any chance you are pregnant?" "When was your last menstrual period?"     No- just had baby- breastfeeding  Protocols used: [place] AMERICA-A-AH, INSECT BITE-A-AH

## 2019-01-12 DIAGNOSIS — Z1389 Encounter for screening for other disorder: Secondary | ICD-10-CM | POA: Diagnosis not present

## 2019-04-04 DIAGNOSIS — Z23 Encounter for immunization: Secondary | ICD-10-CM | POA: Diagnosis not present

## 2019-04-12 ENCOUNTER — Other Ambulatory Visit: Payer: Self-pay

## 2019-04-12 DIAGNOSIS — Z20822 Contact with and (suspected) exposure to covid-19: Secondary | ICD-10-CM

## 2019-04-13 LAB — NOVEL CORONAVIRUS, NAA: SARS-CoV-2, NAA: NOT DETECTED

## 2019-05-22 ENCOUNTER — Other Ambulatory Visit: Payer: Self-pay

## 2019-05-22 DIAGNOSIS — Z20822 Contact with and (suspected) exposure to covid-19: Secondary | ICD-10-CM

## 2019-05-25 LAB — NOVEL CORONAVIRUS, NAA: SARS-CoV-2, NAA: NOT DETECTED

## 2019-06-22 ENCOUNTER — Other Ambulatory Visit: Payer: Self-pay

## 2019-06-23 ENCOUNTER — Encounter: Payer: Self-pay | Admitting: Physician Assistant

## 2019-06-23 ENCOUNTER — Ambulatory Visit (INDEPENDENT_AMBULATORY_CARE_PROVIDER_SITE_OTHER): Payer: BC Managed Care – PPO | Admitting: Physician Assistant

## 2019-06-23 VITALS — BP 110/70 | HR 69 | Temp 97.5°F | Ht 68.0 in | Wt 171.0 lb

## 2019-06-23 DIAGNOSIS — G43009 Migraine without aura, not intractable, without status migrainosus: Secondary | ICD-10-CM | POA: Diagnosis not present

## 2019-06-23 MED ORDER — SUMATRIPTAN SUCCINATE 25 MG PO TABS: 25.0000 mg | ORAL_TABLET | ORAL | 0 refills | Status: DC | PRN

## 2019-06-23 NOTE — Progress Notes (Signed)
Erin [name] is a 39 y.o. female is here for transfer of care.   I acted as a Education administrator for [employer], PA-C Erin Pickler, LPN  History of Present Illness:   Chief Complaint  Patient presents with  . Transfer of care  . Migraine    HPI  Pt is here for transfer of care from Dr. Juleen [name]  Migraine Pt c/o headaches 2-3 times a month, has increased over the past 3 months. Pt says she has been having them for years but now it is at the point where she needs something. Last episode was Monday and lasted 12 hours. Took Tylenol, Naproxen no relief. Does have nausea, sensitivity to light with headaches. Denies auras, weakness/numbness on one side of body, double vision. Does feel like she potentially has issues with her vision in her eyes and is planning for formal eye exam soon.  There are no preventive care reminders to display for this patient.  Past Medical History:  Diagnosis Date  . Depression    PPD NO MEDS  . History of concussion 04/19/2017  . HPV in female   . Vaginal Pap smear, abnormal      Social History   Socioeconomic History  . Marital status: Married    Spouse name: Not on file  . Number of children: Not on file  . Years of education: Not on file  . Highest education level: Not on file  Occupational History  . Not on file  Tobacco Use  . Smoking status: Never Smoker  . Smokeless tobacco: Never Used  Substance and Sexual Activity  . Alcohol use: Yes    Alcohol/week: 1.0 standard drinks    Types: 1 Glasses of wine per week  . Drug use: Never  . Sexual activity: Yes    Partners: Male    Birth control/protection: Surgical    Comment: husband has had vasectomy  Other Topics Concern  . Not on file  Social History Narrative   Has 22 mo old infant and 47 yo son      Social Determinants of Health   Financial Resource Strain: Low Risk   . Difficulty of Paying Living Expenses: Not hard at all  Food Insecurity: No Food Insecurity  . Worried About  Charity fundraiser in the Last Year: Never true  . Ran Out of Food in the Last Year: Never true  Transportation Needs: Unknown  . Lack of Transportation (Medical): No  . Lack of Transportation (Non-Medical): Not on file  Physical Activity:   . Days of Exercise per Week: Not on file  . Minutes of Exercise per Session: Not on file  Stress: Stress Concern Present  . Feeling of Stress : To some extent  Social Connections:   . Frequency of Communication with Friends and Family: Not on file  . Frequency of Social Gatherings with Friends and Family: Not on file  . Attends Religious Services: Not on file  . Active Member of Clubs or Organizations: Not on file  . Attends Archivist Meetings: Not on file  . Marital Status: Not on file  Intimate Partner Violence: Not At Risk  . Fear of Current or Ex-Partner: No  . Emotionally Abused: No  . Physically Abused: No  . Sexually Abused: No    Past Surgical History:  Procedure Laterality Date  . NO PAST SURGERIES      Family History  Problem Relation Age of Onset  . High Cholesterol Mother   .  Hypertension Mother   . Hyperlipidemia Mother   . Obesity Father   . Anxiety disorder Sister   . Depression Sister   . Heart murmur Brother   . Macular degeneration Maternal Grandfather   . Breast cancer Paternal Grandmother 19  . Stroke Paternal Grandfather   . Breast cancer Maternal Aunt     PMHx, SurgHx, SocialHx, FamHx, Medications, and Allergies were reviewed in the Visit Navigator and updated as appropriate.   Patient Active Problem List   Diagnosis Date Noted  . Uterine contractions during pregnancy 12/01/2018  . Term pregnancy 12/01/2018  . 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 Poor posture 02/03/2018    Social History   Tobacco Use  . Smoking status: Never Smoker  . Smokeless tobacco: Never Used  Substance Use Topics  . Alcohol use: Yes    Alcohol/week: 1.0  standard drinks    Types: 1 Glasses of wine per week  . Drug use: Never    Current Medications and Allergies:    Current Outpatient Medications:  .  acetaminophen (TYLENOL) 500 MG tablet, Take 1,000 mg by mouth as needed., Disp: , Rfl:  .  Cholecalciferol (VITAMIN D) 50 MCG (2000 UT) tablet, Take 2,000 Units by mouth daily., Disp: , Rfl:  .  naproxen (NAPROSYN) 500 MG tablet, Take 1,000 mg by mouth as needed., Disp: , Rfl:  .  OVER THE COUNTER MEDICATION, Take 1 capsule by mouth daily. Post Natal vitamin, Disp: , Rfl:  .  SUMAtriptan (IMITREX) 25 MG tablet, Take 1 tablet (25 mg total) by mouth every 2 (two) hours as needed for migraine. May repeat in 2 hours if headache persists or recurs., Disp: 10 tablet, Rfl: 0  No Known Allergies  Review of Systems   ROS  Negative unless otherwise specified per HPI.  Vitals:   Vitals:   06/23/19 0951  BP: 110/70  Pulse: 69  Temp: (!) 97.5 F (36.4 C)  TempSrc: Temporal  SpO2: 99%  Weight: 171 lb (77.6 kg)  Height: 5\' 8"  (1.727 m)     Body mass index is 26 kg/m.   Physical Exam:    Physical Exam Vitals and nursing note reviewed.  Constitutional:      General: She is not in acute distress.    Appearance: She is well-developed. She is not ill-appearing or toxic-appearing.  Cardiovascular:     Rate and Rhythm: Normal rate and regular rhythm.     Pulses: Normal pulses.     Heart sounds: Normal heart sounds, S1 normal and S2 normal.     Comments: No LE edema Pulmonary:     Effort: Pulmonary effort is normal.     Breath sounds: Normal breath sounds.  Skin:    General: Skin is warm and dry.  Neurological:     General: No focal deficit present.     Mental Status: She is alert.     GCS: GCS eye subscore is 4. GCS verbal subscore is 5. GCS motor subscore is 6.     Cranial Nerves: Cranial nerves are intact.     Sensory: Sensation is intact.     Motor: Motor function is intact.     Coordination: Coordination is intact.     Gait:  Gait is intact.  Psychiatric:        Speech: Speech normal.        Behavior: Behavior normal. Behavior is cooperative.      Assessment and Plan:  Erin [name] was seen today for transfer of care and migraine.  Diagnoses and all orders for this visit:  Migraine without aura and without status migrainosus, not intractable Uncontrolled. Difficult to treat 2/2 current breastfeeding status. Discussed options, will treat with OTC Excedrin (without caffeine). If unsuccessful, I have sent in rx of imitrex, may use but must withhhold breastfeeding for at least 12 hours after dose. Worsening precautions advised.  Other orders -     SUMAtriptan (IMITREX) 25 MG tablet; Take 1 tablet (25 mg total) by mouth every 2 (two) hours as needed for migraine. May repeat in 2 hours if headache persists or recurs.    . Reviewed expectations re: course of current medical issues. . Discussed self-management of symptoms. . Outlined signs and symptoms indicating need for more acute intervention. . Patient verbalized understanding and all questions were answered. . See orders for this visit as documented in the electronic medical record. . Patient received an After Visit Summary.  CMA or LPN served as scribe during this visit. History, Physical, and Plan performed by medical provider. The above documentation has been reviewed and is accurate and complete.  Inda Coke, PA-C [place] [place] 06/23/2019  Follow-up: No follow-ups on file.

## 2019-06-23 NOTE — Patient Instructions (Addendum)
Download the [employer] app -- its free!  Go to the eye doctor as you plan.  Consider Excedrin without caffeine.  I have sent in Imitrex should you need it --> withhold breastfeeding for 12 hours after a dose.

## 2019-07-17 ENCOUNTER — Other Ambulatory Visit: Payer: Self-pay

## 2019-07-17 ENCOUNTER — Ambulatory Visit: Payer: BC Managed Care – PPO | Attending: Internal Medicine

## 2019-07-17 DIAGNOSIS — Z20822 Contact with and (suspected) exposure to covid-19: Secondary | ICD-10-CM

## 2019-07-18 LAB — NOVEL CORONAVIRUS, NAA: SARS-CoV-2, NAA: NOT DETECTED

## 2019-07-21 ENCOUNTER — Other Ambulatory Visit: Payer: Self-pay | Admitting: Physician Assistant

## 2019-07-21 DIAGNOSIS — Z20828 Contact with and (suspected) exposure to other viral communicable diseases: Secondary | ICD-10-CM | POA: Diagnosis not present

## 2019-08-11 DIAGNOSIS — H524 Presbyopia: Secondary | ICD-10-CM | POA: Diagnosis not present

## 2019-09-05 ENCOUNTER — Telehealth: Payer: Self-pay | Admitting: Physician Assistant

## 2019-09-05 DIAGNOSIS — L03113 Cellulitis of right upper limb: Secondary | ICD-10-CM | POA: Diagnosis not present

## 2019-09-05 DIAGNOSIS — L255 Unspecified contact dermatitis due to plants, except food: Secondary | ICD-10-CM | POA: Diagnosis not present

## 2019-09-05 NOTE — Telephone Encounter (Signed)
Please call pt and offer 11:30 or 1:00 PM in office visit okay per [facility].

## 2019-09-05 NOTE — Telephone Encounter (Signed)
Patient called in wondering if she would be able to get an appointment today with Sam, states she has had poison ivy for almost a week and has tried everything but nothing has helped. Offered an appointment for tomorrow but patient states she would prefer today rather than waiting any longer.

## 2019-09-05 NOTE — Telephone Encounter (Signed)
LVM for patient to call back and schedule appt with [facility] today.

## 2019-09-26 ENCOUNTER — Other Ambulatory Visit: Payer: BC Managed Care – PPO

## 2019-09-26 ENCOUNTER — Telehealth (INDEPENDENT_AMBULATORY_CARE_PROVIDER_SITE_OTHER): Payer: BC Managed Care – PPO | Admitting: Physician Assistant

## 2019-09-26 ENCOUNTER — Encounter: Payer: Self-pay | Admitting: Physician Assistant

## 2019-09-26 VITALS — Temp 97.6°F | Ht 68.0 in | Wt 165.0 lb

## 2019-09-26 DIAGNOSIS — R6889 Other general symptoms and signs: Secondary | ICD-10-CM | POA: Diagnosis not present

## 2019-09-26 NOTE — Progress Notes (Signed)
Virtual Visit via Video   I connected with Erin [name] on 09/26/19 at  7:30 AM EDT by a video enabled telemedicine application and verified that I am speaking with the correct person using two identifiers. Location patient: Home Location provider: [facility], Office Persons participating in the virtual visit: Erin [name], Erin Coke PA-C  I discussed the limitations of evaluation and management by telemedicine and the availability of in person appointments. The patient expressed understanding and agreed to proceed.  Subjective:   HPI:   Patient is requesting evaluation for possible COVID-19.  Symptom onset: Started on Sunday  Travel/contacts: No travel, pt's daughter sick last week and son sick now  Patient is currently breastfeeding  Patient endorses the following symptoms: sinus congestion, rhinorrhea, ear pain and myalgia, loss of smell.  Patient denies the following symptoms: Fever (none), sinus headache, shortness of breath, chest tightness and chest pain  Treatments tried: Dayquil, nasal spray and Ibuprofen  Patient had confirmed COVID-19 in Feb 2020 and is 8 days out of her second dose of COVID-19 vaccination.  Patient risk factors: Current [id number] risk of complications score: 0 Smoking status: CHAREESE VETH  reports that she has never smoked. She has never used smokeless tobacco. If female, currently pregnant? []   Yes []   No  ROS: See pertinent positives and negatives per HPI.  Patient Active Problem List   Diagnosis Date Noted  . Migraine without aura and without status migrainosus, not intractable 06/23/2019  . 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 Poor posture 02/03/2018    Social History   Tobacco Use  . Smoking status: Never Smoker  . Smokeless tobacco: Never Used  Substance Use Topics  . Alcohol use: Yes    Alcohol/week: 1.0 standard drinks    Types: 1 Glasses of wine per week     Current Outpatient Medications:  .  acetaminophen (TYLENOL) 500 MG tablet, Take 1,000 mg by mouth as needed., Disp: , Rfl:  .  Cholecalciferol (VITAMIN D) 50 MCG (2000 UT) tablet, Take 2,000 Units by mouth daily., Disp: , Rfl:  .  naproxen (NAPROSYN) 500 MG tablet, Take 1,000 mg by mouth as needed., Disp: , Rfl:  .  OVER THE COUNTER MEDICATION, Take 1 capsule by mouth daily. Post Natal vitamin, Disp: , Rfl:  .  SUMAtriptan (IMITREX) 25 MG tablet, TAKE 1 TABLET BY MOUTH AS NEEDED FOR MIGRAINE. MAY REPEAT IN 2 HOURS IF HEADACHE PERSISTS OR RECURS., Disp: 9 tablet, Rfl: 1  No Known Allergies  Objective:   VITALS: Per patient if applicable, see vitals. GENERAL: Alert, appears well and in no acute distress. HEENT: Atraumatic, conjunctiva clear, no obvious abnormalities on inspection of external nose and ears. NECK: Normal movements of the head and neck. CARDIOPULMONARY: No increased WOB. Speaking in clear sentences. I:E ratio WNL.  MS: Moves all visible extremities without noticeable abnormality. PSYCH: Pleasant and cooperative, well-groomed. Speech normal rate and rhythm. Affect is appropriate. Insight and judgement are appropriate. Attention is focused, linear, and appropriate.  NEURO: CN grossly intact. Oriented as arrived to appointment on time with no prompting. Moves both UE equally.  SKIN: No obvious lesions, wounds, erythema, or cyanosis noted on face or hands.  Assessment and Plan:   [name] was seen today for covid symptoms.  Diagnoses and all orders for this visit:  Flu-like symptoms   Patient has a respiratory illness without signs of acute distress or respiratory compromise at this time.  This is likely a viral infection, which can come from a number of respiratory viruses.   No red flags on discussion.  Patient is concerned because she does not know what is causing this flulike illness in her household.  We are going to send her for COVID-19 testing and also if this is  negative, we will do a car visit to have her tested for strep and flu in our parking lot.  Worsening precautions advised.  As a precaution, they have been advised to remain home until COVID-19 results and then possible further quarantine after that based on results and symptoms. Advised if they experience a "second sickening" or worsening symptoms as the illness progresses, they are to call the office for further instructions or seek emergent evaluation for any severe symptoms.   . Reviewed expectations re: course of current medical issues. . Discussed self-management of symptoms. . Outlined signs and symptoms indicating need for more acute intervention. . Patient verbalized understanding and all questions were answered. Marland Kitchen Health Maintenance issues including appropriate healthy diet, exercise, and smoking avoidance were discussed with patient. . See orders for this visit as documented in the electronic medical record.  I discussed the assessment and treatment plan with the patient. The patient was provided an opportunity to ask questions and all were answered. The patient agreed with the plan and demonstrated an understanding of the instructions.   The patient was advised to call back or seek an in-person evaluation if the symptoms worsen or if the condition fails to improve as anticipated.   CMA or LPN served as scribe during this visit. History, Physical, and Plan performed by medical provider. The above documentation has been reviewed and is accurate and complete.  [place], [place] 09/26/2019

## 2019-11-10 DIAGNOSIS — H35721 Serous detachment of retinal pigment epithelium, right eye: Secondary | ICD-10-CM | POA: Diagnosis not present

## 2020-01-05 ENCOUNTER — Telehealth: Payer: Self-pay | Admitting: Physician Assistant

## 2020-01-05 ENCOUNTER — Other Ambulatory Visit: Payer: Self-pay

## 2020-01-05 DIAGNOSIS — Z9889 Other specified postprocedural states: Secondary | ICD-10-CM

## 2020-01-05 DIAGNOSIS — M7989 Other specified soft tissue disorders: Secondary | ICD-10-CM

## 2020-01-05 DIAGNOSIS — D229 Melanocytic nevi, unspecified: Secondary | ICD-10-CM

## 2020-01-05 DIAGNOSIS — Z872 Personal history of diseases of the skin and subcutaneous tissue: Secondary | ICD-10-CM

## 2020-01-05 NOTE — Telephone Encounter (Signed)
Referral placed for pt to see dermatology for skin mole removal.

## 2020-01-05 NOTE — Telephone Encounter (Signed)
Patient is calling in asking for a referral to a Dermatologist for of her moles.

## 2020-01-05 NOTE — Telephone Encounter (Signed)
Ok to refer to derm?

## 2020-01-05 NOTE — Telephone Encounter (Signed)
Ok for dermatology referral.

## 2020-01-05 NOTE — Addendum Note (Signed)
Addended bySerita Sheller on: 01/05/2020 02:17 PM   Modules accepted: Orders

## 2020-01-11 DIAGNOSIS — D2261 Melanocytic nevi of right upper limb, including shoulder: Secondary | ICD-10-CM | POA: Diagnosis not present

## 2020-01-11 DIAGNOSIS — D2262 Melanocytic nevi of left upper limb, including shoulder: Secondary | ICD-10-CM | POA: Diagnosis not present

## 2020-01-11 DIAGNOSIS — D2272 Melanocytic nevi of left lower limb, including hip: Secondary | ICD-10-CM | POA: Diagnosis not present

## 2020-01-11 DIAGNOSIS — D1721 Benign lipomatous neoplasm of skin and subcutaneous tissue of right arm: Secondary | ICD-10-CM | POA: Diagnosis not present

## 2020-01-18 ENCOUNTER — Other Ambulatory Visit: Payer: Self-pay | Admitting: Radiology

## 2020-01-18 ENCOUNTER — Other Ambulatory Visit: Payer: BC Managed Care – PPO

## 2020-01-18 DIAGNOSIS — Z20822 Contact with and (suspected) exposure to covid-19: Secondary | ICD-10-CM

## 2020-01-19 LAB — SARS-COV-2, NAA 2 DAY TAT

## 2020-01-19 LAB — NOVEL CORONAVIRUS, NAA: SARS-CoV-2, NAA: NOT DETECTED

## 2020-01-21 DIAGNOSIS — J019 Acute sinusitis, unspecified: Secondary | ICD-10-CM | POA: Diagnosis not present

## 2020-01-21 DIAGNOSIS — J029 Acute pharyngitis, unspecified: Secondary | ICD-10-CM | POA: Diagnosis not present

## 2020-02-15 ENCOUNTER — Encounter: Payer: Self-pay | Admitting: Family Medicine

## 2020-02-15 ENCOUNTER — Ambulatory Visit (INDEPENDENT_AMBULATORY_CARE_PROVIDER_SITE_OTHER): Payer: BC Managed Care – PPO | Admitting: Family Medicine

## 2020-02-15 ENCOUNTER — Other Ambulatory Visit: Payer: Self-pay

## 2020-02-15 VITALS — BP 118/74 | HR 53 | Temp 97.8°F | Resp 18 | Ht 68.0 in | Wt 164.2 lb

## 2020-02-15 DIAGNOSIS — H9203 Otalgia, bilateral: Secondary | ICD-10-CM

## 2020-02-15 DIAGNOSIS — H6983 Other specified disorders of Eustachian tube, bilateral: Secondary | ICD-10-CM | POA: Diagnosis not present

## 2020-02-15 DIAGNOSIS — Z23 Encounter for immunization: Secondary | ICD-10-CM | POA: Diagnosis not present

## 2020-02-15 NOTE — Progress Notes (Signed)
Subjective  CC:  Chief Complaint  Patient presents with   Bilateral Ear Pain    Started 2 weeks ago, was seen in urgent care for strep throat (negative), was prescribed a antibiotic for her ears but never finished. Ear pain came back Tuesday.    Same day acute visit; PCP not available. New pt to me. Chart reviewed.   HPI: Erin [name] is a 39 y.o. female who presents to the office today to address the problems listed above in the chief complaint.  39 year old presents with a sensation of dull ear pain for the last 2 days.  It is intermittent sharp.  Not associated with fevers, cold symptoms, sore throat, cough or malaise.  She had acute pharyngitis almost a month ago, evaluated in urgent care.  I reviewed that note.  Strep was negative.  Covid test was negative.  She was treated with Augmentin but only took a partial course.  Her symptoms resolved within a week.  She was swimming in the meantime.  She denies ear drainage or hearing loss.  No trauma.  Occasionally she will have mild allergy symptoms.  Otherwise feels well   Assessment  1. Otalgia of both ears   2. ETD (Eustachian tube dysfunction), bilateral      Plan   Otalgia: Reassured.  No signs or symptoms of bacterial infection.  Palpable teaching tube dysfunction.  Educated.  Start over-the-counter antihistamine monitor.  Return for worsening pain.  IF URI symptoms develop, recommend Covid testing again. Flu vaccine given today  Follow up: As needed Visit date not found  Orders Placed This Encounter  Procedures   Flu Vaccine QUAD 6+ mos PF IM (Fluarix Quad PF)   No orders of the defined types were placed in this encounter.     I reviewed the patients updated PMH, FH, and SocHx.    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   Cholecalciferol (VITAMIN D) 50 MCG (2000 UT) tablet Take 2,000 Units by mouth daily.   ibuprofen (ADVIL) 200 MG tablet Take by mouth as needed.   Multiple Vitamin (MULTI-VITAMIN) tablet Take 1 tablet by mouth daily.   naproxen (NAPROSYN) 500 MG tablet Take 1,000 mg by mouth as needed.   SUMAtriptan (IMITREX) 25 MG tablet TAKE 1 TABLET BY MOUTH AS NEEDED FOR MIGRAINE. MAY REPEAT IN 2 HOURS IF HEADACHE PERSISTS OR RECURS.    Allergies: Patient has No Known Allergies. Family History: Patient family history includes Anxiety disorder in her sister; Breast cancer in her maternal aunt; Breast cancer (age of onset: 64) in her paternal grandmother; Depression in her sister; Heart murmur in her brother; High Cholesterol in her mother; Hyperlipidemia in her mother; Hypertension in her mother; Macular degeneration in her maternal grandfather; Obesity in her father; Stroke in her paternal grandfather. Social History:  Patient  reports that she has never smoked. She has never used smokeless tobacco. She reports current alcohol use of about 1.0 standard drink of alcohol per week. She reports that she does not use drugs.  Review of Systems: Constitutional: Negative for fever malaise or anorexia Cardiovascular: negative for chest pain Respiratory: negative for SOB or persistent cough Gastrointestinal: negative for  abdominal pain  Objective  Vitals: BP 118/74    Pulse (!) 53    Temp 97.8 F (36.6 C) (Temporal)    Resp 18    Ht 5\' 8"  (1.727 m)    Wt 164 lb 3.2 oz (74.5 kg)    SpO2 98%    BMI 24.97 kg/m  General: no acute distress , A&Ox3 HEENT: PEERL, conjunctiva normal, bilateral TMs with normal landmarks, normal EACs, no cervical adenopathy neck is supple Cardiovascular:  RRR without murmur or gallop.  Respiratory:  Good breath sounds bilaterally, CTAB with normal  respiratory effort     Commons side effects, risks, benefits, and alternatives for medications and treatment plan prescribed today were discussed, and the patient expressed understanding of the given instructions. Patient is instructed to call or message via MyChart if he/she has any questions or concerns regarding our treatment plan. No barriers to understanding were identified. We discussed Red Flag symptoms and signs in detail. Patient expressed understanding regarding what to do in case of urgent or emergency type symptoms.   Medication list was reconciled, printed and provided to the patient in AVS. Patient instructions and summary information was reviewed with the patient as documented in the AVS. This note was prepared with assistance of Dragon voice recognition software. Occasional wrong-word or sound-a-like substitutions may have occurred due to the inherent limitations of voice recognition software  This visit occurred during the SARS-CoV-2 public health emergency.  Safety protocols were in place, including screening questions prior to the visit, additional usage of staff PPE, and extensive cleaning of exam room while observing appropriate contact time as indicated for disinfecting solutions.

## 2020-02-15 NOTE — Patient Instructions (Addendum)
Please follow up if symptoms do not improve or as needed.   Start a claritin or zyrtec to see if that helps the pressure sensation in your ears. Let me know if more pain develops.  Today you were given your Flu vaccination.   Eustachian Tube Dysfunction  Eustachian tube dysfunction refers to a condition in which a blockage develops in the narrow passage that connects the middle ear to the back of the nose (eustachian tube). The eustachian tube regulates air pressure in the middle ear by letting air move between the ear and nose. It also helps to drain fluid from the middle ear space. Eustachian tube dysfunction can affect one or both ears. When the eustachian tube does not function properly, air pressure, fluid, or both can build up in the middle ear. What are the causes? This condition occurs when the eustachian tube becomes blocked or cannot open normally. Common causes of this condition include:  Ear infections.  Colds and other infections that affect the nose, mouth, and throat (upper respiratory tract).  Allergies.  Irritation from cigarette smoke.  Irritation from stomach acid coming up into the esophagus (gastroesophageal reflux). The esophagus is the tube that carries food from the mouth to the stomach.  Sudden changes in air pressure, such as from descending in an airplane or scuba diving.  Abnormal growths in the nose or throat, such as: ? Growths that line the nose (nasal polyps). ? Abnormal growth of cells (tumors). ? Enlarged tissue at the back of the throat (adenoids). What increases the risk? You are more likely to develop this condition if:  You smoke.  You are overweight.  You are a child who has: ? Certain birth defects of the mouth, such as cleft palate. ? Large tonsils or adenoids. What are the signs or symptoms? Common symptoms of this condition include:  A feeling of fullness in the ear.  Ear pain.  Clicking or popping noises in the ear.  Ringing  in the ear.  Hearing loss.  Loss of balance.  Dizziness. Symptoms may get worse when the air pressure around you changes, such as when you travel to an area of high elevation, fly on an airplane, or go scuba diving. How is this diagnosed? This condition may be diagnosed based on:  Your symptoms.  A physical exam of your ears, nose, and throat.  Tests, such as those that measure: ? The movement of your eardrum (tympanogram). ? Your hearing (audiometry). How is this treated? Treatment depends on the cause and severity of your condition.  In mild cases, you may relieve your symptoms by moving air into your ears. This is called "popping the ears."  In more severe cases, or if you have symptoms of fluid in your ears, treatment may include: ? Medicines to relieve congestion (decongestants). ? Medicines that treat allergies (antihistamines). ? Nasal sprays or ear drops that contain medicines that reduce swelling (steroids). ? A procedure to drain the fluid in your eardrum (myringotomy). In this procedure, a small tube is placed in the eardrum to:  Drain the fluid.  Restore the air in the middle ear space. ? A procedure to insert a balloon device through the nose to inflate the opening of the eustachian tube (balloon dilation). Follow these instructions at home: Lifestyle  Do not do any of the following until your health care provider approves: ? Travel to high altitudes. ? Fly in airplanes. ? Work in a Pension scheme manager or room. ? Scuba dive.  Do  not use any products that contain nicotine or tobacco, such as cigarettes and e-cigarettes. If you need help quitting, ask your health care provider.  Keep your ears dry. Wear fitted earplugs during showering and bathing. Dry your ears completely after. General instructions  Take over-the-counter and prescription medicines only as told by your health care provider.  Use techniques to help pop your ears as recommended by your health  care provider. These may include: ? Chewing gum. ? Yawning. ? Frequent, forceful swallowing. ? Closing your mouth, holding your nose closed, and gently blowing as if you are trying to blow air out of your nose.  Keep all follow-up visits as told by your health care provider. This is important. Contact a health care provider if:  Your symptoms do not go away after treatment.  Your symptoms come back after treatment.  You are unable to pop your ears.  You have: ? A fever. ? Pain in your ear. ? Pain in your head or neck. ? Fluid draining from your ear.  Your hearing suddenly changes.  You become very dizzy.  You lose your balance. Summary  Eustachian tube dysfunction refers to a condition in which a blockage develops in the eustachian tube.  It can be caused by ear infections, allergies, inhaled irritants, or abnormal growths in the nose or throat.  Symptoms include ear pain, hearing loss, or ringing in the ears.  Mild cases are treated with maneuvers to unblock the ears, such as yawning or ear popping.  Severe cases are treated with medicines. Surgery may also be done (rare). This information is not intended to replace advice given to you by your health care provider. Make sure you discuss any questions you have with your health care provider. Document Revised: 09/14/2017 Document Reviewed: 09/14/2017 Elsevier Patient Education  [place].

## 2020-02-29 DIAGNOSIS — Z6825 Body mass index (BMI) 25.0-25.9, adult: Secondary | ICD-10-CM | POA: Diagnosis not present

## 2020-02-29 DIAGNOSIS — Z01419 Encounter for gynecological examination (general) (routine) without abnormal findings: Secondary | ICD-10-CM | POA: Diagnosis not present

## 2020-03-05 ENCOUNTER — Other Ambulatory Visit: Payer: Self-pay | Admitting: Critical Care Medicine

## 2020-03-05 ENCOUNTER — Other Ambulatory Visit: Payer: BC Managed Care – PPO

## 2020-03-05 DIAGNOSIS — Z20822 Contact with and (suspected) exposure to covid-19: Secondary | ICD-10-CM

## 2020-03-06 LAB — SARS-COV-2, NAA 2 DAY TAT

## 2020-03-06 LAB — NOVEL CORONAVIRUS, NAA: SARS-CoV-2, NAA: NOT DETECTED

## 2020-04-03 DIAGNOSIS — N760 Acute vaginitis: Secondary | ICD-10-CM | POA: Diagnosis not present

## 2020-04-03 DIAGNOSIS — L292 Pruritus vulvae: Secondary | ICD-10-CM | POA: Diagnosis not present

## 2020-08-13 DIAGNOSIS — H52221 Regular astigmatism, right eye: Secondary | ICD-10-CM | POA: Diagnosis not present

## 2020-08-13 DIAGNOSIS — H524 Presbyopia: Secondary | ICD-10-CM | POA: Diagnosis not present

## 2020-10-07 DIAGNOSIS — Z1231 Encounter for screening mammogram for malignant neoplasm of breast: Secondary | ICD-10-CM | POA: Diagnosis not present

## 2020-10-22 ENCOUNTER — Other Ambulatory Visit: Payer: Self-pay | Admitting: Obstetrics and Gynecology

## 2020-10-22 DIAGNOSIS — R928 Other abnormal and inconclusive findings on diagnostic imaging of breast: Secondary | ICD-10-CM

## 2020-10-25 ENCOUNTER — Ambulatory Visit
Admission: RE | Admit: 2020-10-25 | Discharge: 2020-10-25 | Disposition: A | Discharge status: Home / Self Care | Payer: BC Managed Care – PPO | Source: Ambulatory Visit | Attending: Obstetrics and Gynecology | Admitting: Obstetrics and Gynecology

## 2020-10-25 ENCOUNTER — Other Ambulatory Visit: Payer: Self-pay

## 2020-10-25 ENCOUNTER — Other Ambulatory Visit: Payer: Self-pay | Admitting: Obstetrics and Gynecology

## 2020-10-25 DIAGNOSIS — R928 Other abnormal and inconclusive findings on diagnostic imaging of breast: Secondary | ICD-10-CM

## 2020-10-25 DIAGNOSIS — R922 Inconclusive mammogram: Secondary | ICD-10-CM | POA: Diagnosis not present

## 2020-10-25 DIAGNOSIS — R921 Mammographic calcification found on diagnostic imaging of breast: Secondary | ICD-10-CM

## 2020-11-07 DIAGNOSIS — Z20822 Contact with and (suspected) exposure to covid-19: Secondary | ICD-10-CM | POA: Diagnosis not present

## 2020-11-19 ENCOUNTER — Other Ambulatory Visit: Payer: Self-pay | Admitting: Physician Assistant

## 2020-11-26 ENCOUNTER — Other Ambulatory Visit: Payer: Self-pay

## 2020-11-26 ENCOUNTER — Ambulatory Visit (INDEPENDENT_AMBULATORY_CARE_PROVIDER_SITE_OTHER): Payer: BC Managed Care – PPO | Admitting: Family

## 2020-11-26 ENCOUNTER — Encounter: Payer: Self-pay | Admitting: Family

## 2020-11-26 VITALS — BP 100/68 | HR 74 | Temp 98.1°F | Ht 68.0 in | Wt 169.4 lb

## 2020-11-26 DIAGNOSIS — Z1329 Encounter for screening for other suspected endocrine disorder: Secondary | ICD-10-CM

## 2020-11-26 DIAGNOSIS — Z130 Encounter for screening for diseases of the blood and blood-forming organs and certain disorders involving the immune mechanism: Secondary | ICD-10-CM | POA: Diagnosis not present

## 2020-11-26 DIAGNOSIS — Z13228 Encounter for screening for other metabolic disorders: Secondary | ICD-10-CM | POA: Diagnosis not present

## 2020-11-26 DIAGNOSIS — Z0000 Encounter for general adult medical examination without abnormal findings: Secondary | ICD-10-CM

## 2020-11-26 DIAGNOSIS — Z13220 Encounter for screening for lipoid disorders: Secondary | ICD-10-CM

## 2020-11-26 DIAGNOSIS — B354 Tinea corporis: Secondary | ICD-10-CM

## 2020-11-26 DIAGNOSIS — Z91038 Other insect allergy status: Secondary | ICD-10-CM

## 2020-11-26 MED ORDER — MICONAZOLE NITRATE 2 % EX CREA: 1.0000 "application " | TOPICAL_CREAM | Freq: Two times a day (BID) | CUTANEOUS | 0 refills | Status: DC

## 2020-11-26 MED ORDER — EPINEPHRINE 0.3 MG/0.3ML IJ SOAJ: 0.3000 mg | INTRAMUSCULAR | 0 refills | Status: DC | PRN

## 2020-11-26 NOTE — Patient Instructions (Signed)
Health Maintenance, Female Adopting a healthy lifestyle and getting preventive care are important in promoting health and wellness. Ask your health care provider about: The right schedule for you to have regular tests and exams. Things you can do on your own to prevent diseases and keep yourself healthy. What should I know about diet, weight, and exercise? Eat a healthy diet  Eat a diet that includes plenty of vegetables, fruits, low-fat dairy products, and lean protein. Do not eat a lot of foods that are high in solid fats, added sugars, or sodium.  Maintain a healthy weight Body mass index (BMI) is used to identify weight problems. It estimates body fat based on height and weight. Your health care provider can help determineyour BMI and help you achieve or maintain a healthy weight. Get regular exercise Get regular exercise. This is one of the most important things you can do for your health. Most adults should: Exercise for at least 150 minutes each week. The exercise should increase your heart rate and make you sweat (moderate-intensity exercise). Do strengthening exercises at least twice a week. This is in addition to the moderate-intensity exercise. Spend less time sitting. Even light physical activity can be beneficial. Watch cholesterol and blood lipids Have your blood tested for lipids and cholesterol at 42 years of age, then havethis test every 5 years. Have your cholesterol levels checked more often if: Your lipid or cholesterol levels are high. You are older than 42 years of age. You are at high risk for heart disease. What should I know about cancer screening? Depending on your health history and family history, you may need to have cancer screening at various ages. This may include screening for: Breast cancer. Cervical cancer. Colorectal cancer. Skin cancer. Lung cancer. What should I know about heart disease, diabetes, and high blood pressure? Blood pressure and heart  disease High blood pressure causes heart disease and increases the risk of stroke. This is more likely to develop in people who have high blood pressure readings, are of African descent, or are overweight. Have your blood pressure checked: Every 3-5 years if you are 18-39 years of age. Every year if you are 40 years old or older. Diabetes Have regular diabetes screenings. This checks your fasting blood sugar level. Have the screening done: Once every three years after age 40 if you are at a normal weight and have a low risk for diabetes. More often and at a younger age if you are overweight or have a high risk for diabetes. What should I know about preventing infection? Hepatitis B If you have a higher risk for hepatitis B, you should be screened for this virus. Talk with your health care provider to find out if you are at risk forhepatitis B infection. Hepatitis C Testing is recommended for: Everyone born from 1945 through 1965. Anyone with known risk factors for hepatitis C. Sexually transmitted infections (STIs) Get screened for STIs, including gonorrhea and chlamydia, if: You are sexually active and are younger than 42 years of age. You are older than 42 years of age and your health care provider tells you that you are at risk for this type of infection. Your sexual activity has changed since you were last screened, and you are at increased risk for chlamydia or gonorrhea. Ask your health care provider if you are at risk. Ask your health care provider about whether you are at high risk for HIV. Your health care provider may recommend a prescription medicine to help   prevent HIV infection. If you choose to take medicine to prevent HIV, you should first get tested for HIV. You should then be tested every 3 months for as long as you are taking the medicine. Pregnancy If you are about to stop having your period (premenopausal) and you may become pregnant, seek counseling before you get  pregnant. Take 400 to 800 micrograms (mcg) of folic acid every day if you become pregnant. Ask for birth control (contraception) if you want to prevent pregnancy. Osteoporosis and menopause Osteoporosis is a disease in which the bones lose minerals and strength with aging. This can result in bone fractures. If you are 71 years old or older, or if you are at risk for osteoporosis and fractures, ask your health care provider if you should: Be screened for bone loss. Take a calcium or vitamin D supplement to lower your risk of fractures. Be given hormone replacement therapy (HRT) to treat symptoms of menopause. Follow these instructions at home: Lifestyle Do not use any products that contain nicotine or tobacco, such as cigarettes, e-cigarettes, and chewing tobacco. If you need help quitting, ask your health care provider. Do not use street drugs. Do not share needles. Ask your health care provider for help if you need support or information about quitting drugs. Alcohol use Do not drink alcohol if: Your health care provider tells you not to drink. You are pregnant, may be pregnant, or are planning to become pregnant. If you drink alcohol: Limit how much you use to 0-1 drink a day. Limit intake if you are breastfeeding. Be aware of how much alcohol is in your drink. In the [place] one drink equals one 12 oz bottle of beer (355 mL), one 5 oz glass of wine (148 mL), or one 1 oz glass of hard liquor (44 mL). General instructions Schedule regular health, dental, and eye exams. Stay current with your vaccines. Tell your health care provider if: You often feel depressed. You have ever been abused or do not feel safe at home. Summary Adopting a healthy lifestyle and getting preventive care are important in promoting health and wellness. Follow your health care provider's instructions about healthy diet, exercising, and getting tested or screened for diseases. Follow your health care provider's  instructions on monitoring your cholesterol and blood pressure. This information is not intended to replace advice given to you by your health care provider. Make sure you discuss any questions you have with your healthcare provider. Document Revised: 05/18/2018 Document Reviewed: 05/18/2018 Elsevier Patient Education  2022 [place] and Cholesterol Restricted Eating Plan Eating a diet that limits fat and cholesterol may help lower your risk for heart disease and other conditions. Your body needs fat and cholesterol for basic functions, but eating too much of these things can be harmful to yourhealth. Your health care provider may order lab tests to check your blood fat (lipid) and cholesterol levels. This helps your health care provider understand your risk for certain conditions and whether you need to make diet changes. Work with your health care provider or dietitian to make an eating plan that isright for you. Your plan includes: Limit your fat intake to ______% or less of your total calories a day. Limit your saturated fat intake to ______% or less of your total calories a day. Limit the amount of cholesterol in your diet to less than _________mg a day. Eat ___________ g of fiber a day. What are tips for following this plan? General guidelines  If  you are overweight, work with your health care provider to lose weight safely. Losing just 5-10% of your body weight can improve your overall health and help prevent diseases such as diabetes and heart disease. Avoid: Foods with added sugar. Fried foods. Foods that contain partially hydrogenated oils, including stick margarine, some tub margarines, cookies, crackers, and other baked goods. Limit alcohol intake to no more than 1 drink a day for nonpregnant women and 2 drinks a day for men. One drink equals 12 oz of beer, 5 oz of wine, or 1 oz of hard liquor.  Reading food labels Check food labels for: Trans fats, partially  hydrogenated oils, or high amounts of saturated fat. Avoid foods that contain saturated fat and trans fat. The amount of cholesterol in each serving. Try to eat no more than 200 mg of cholesterol each day. The amount of fiber in each serving. Try to eat at least 20-30 g of fiber each day. Choose foods with healthy fats, such as: Monounsaturated and polyunsaturated fats. These include olive and canola oil, flaxseeds, walnuts, almonds, and seeds. Omega-3 fats. These are found in foods such as salmon, mackerel, sardines, tuna, flaxseed oil, and ground flaxseeds. Choose grain products that have whole grains. Look for the word "whole" as the first word in the ingredient list. Cooking Cook foods using methods other than frying. Baking, boiling, grilling, and broiling are some healthy options. Eat more home-cooked food and less restaurant, buffet, and fast food. Avoid cooking using saturated fats. Animal sources of saturated fats include meats, butter, and cream. Plant sources of saturated fats include palm oil, palm kernel oil, and coconut oil. Meal planning  At meals, imagine dividing your plate into fourths: Fill one-half of your plate with vegetables and Trow salads. Fill one-fourth of your plate with whole grains. Fill one-fourth of your plate with lean protein foods. Eat fish that is high in omega-3 fats at least two times a week. Eat more foods that contain fiber, such as whole grains, beans, apples, broccoli, carrots, peas, and barley. These foods help promote healthy cholesterol levels in the blood.  Recommended foods Grains Whole grains, such as whole wheat or whole grain breads, crackers, cereals, and pasta. Unsweetened oatmeal, bulgur, barley, quinoa, or brown rice. Corn or whole wheat flour tortillas. Vegetables Fresh or frozen vegetables (raw, steamed, roasted, or grilled). Buysse salads. Fruits All fresh, canned (in natural juice), or frozen fruits. Meats and other protein  foods Ground beef (85% or leaner), grass-fed beef, or beef trimmed of fat. Skinless chicken or [place]. Ground chicken or [place]. Pork trimmed of fat. All fish and seafood. Egg whites. Dried beans, peas, or lentils. Unsalted nuts or seeds. Unsalted canned beans. Natural nut butters without added sugar and oil. Dairy Low-fat or nonfat dairy products, such as skim or 1% milk, 2% or reduced-fat cheeses, low-fat and fat-free ricotta or cottage cheese, or plain low-fat and nonfat yogurt. Fats and oils Tub margarine without trans fats. Light or reduced-fat mayonnaise and salad dressings. Avocado. Olive, canola, sesame, or safflower oils. The items listed above may not be a complete list of foods and beverages you can eat. Contact a dietitian for more information. Foods to avoid Grains White bread. White pasta. White rice. Cornbread. Bagels, pastries, and croissants. Crackers and snack foods that contain trans fat and hydrogenated oils. Vegetables Vegetables cooked in cheese, cream, or butter sauce. Fried vegetables. Fruits Canned fruit in heavy syrup. Fruit in cream or butter sauce. Fried fruit. Meats and other protein foods  Fatty cuts of meat. Ribs, chicken wings, bacon, sausage, bologna, salami, chitterlings, fatback, hot dogs, bratwurst, and packaged lunch meats. Liver and organ meats. Whole eggs and egg yolks. Chicken and [place] with skin. Fried meat. Dairy Whole or 2% milk, cream, half-and-half, and cream cheese. Whole milk cheeses. Whole-fat or sweetened yogurt. Full-fat cheeses. Nondairy creamers and whipped toppings. Processed cheese, cheese spreads, and cheese curds. Beverages Alcohol. Sugar-sweetened drinks such as sodas, lemonade, and fruit drinks. Fats and oils Butter, stick margarine, lard, shortening, ghee, or bacon fat. Coconut, palm kernel, and palm oils. Sweets and desserts Corn syrup, sugars, honey, and molasses. Candy. Jam and jelly. Syrup. Sweetened cereals. Cookies, pies, cakes,  donuts, muffins, and ice cream. The items listed above may not be a complete list of foods and beverages you should avoid. Contact a dietitian for more information. Summary Your body needs fat and cholesterol for basic functions. However, eating too much of these things can be harmful to your health. Work with your health care provider and dietitian to follow a diet low in fat and cholesterol. Doing this may help lower your risk for heart disease and other conditions. Choose healthy fats, such as monounsaturated and polyunsaturated fats, and foods high in omega-3 fatty acids. Eat fiber-rich foods, such as whole grains, beans, peas, fruits, and vegetables. Limit or avoid alcohol, fried foods, and foods high in saturated fats, partially hydrogenated oils, and sugar. This information is not intended to replace advice given to you by your health care provider. Make sure you discuss any questions you have with your healthcare provider. Document Revised: 01/24/2020 Document Reviewed: 09/27/2019 Elsevier Patient Education  2022 [employer].

## 2020-11-27 LAB — CBC WITH DIFFERENTIAL/PLATELET
Basophils Absolute: 0.1 10*3/uL (ref 0.0–0.1)
Basophils Relative: 0.8 % (ref 0.0–3.0)
Eosinophils Absolute: 2.8 10*3/uL — ABNORMAL HIGH (ref 0.0–0.7)
Eosinophils Relative: 23.9 % — ABNORMAL HIGH (ref 0.0–5.0)
HCT: 37 % (ref 36.0–46.0)
Hemoglobin: 12.3 g/dL (ref 12.0–15.0)
Lymphocytes Relative: 23.7 % (ref 12.0–46.0)
Lymphs Abs: 2.8 10*3/uL (ref 0.7–4.0)
MCHC: 33.2 g/dL (ref 30.0–36.0)
MCV: 80.8 fl (ref 78.0–100.0)
Monocytes Absolute: 0.6 10*3/uL (ref 0.1–1.0)
Monocytes Relative: 5.2 % (ref 3.0–12.0)
Neutro Abs: 5.4 10*3/uL (ref 1.4–7.7)
Neutrophils Relative %: 46.4 % (ref 43.0–77.0)
Platelets: 347 10*3/uL (ref 150.0–400.0)
RBC: 4.58 Mil/uL (ref 3.87–5.11)
RDW: 13.8 % (ref 11.5–15.5)
WBC: 11.8 10*3/uL — ABNORMAL HIGH (ref 4.0–10.5)

## 2020-11-27 LAB — LIPID PANEL
Cholesterol: 170 mg/dL (ref 0–200)
HDL: 42.8 mg/dL (ref >39.00)
LDL Cholesterol: 100 mg/dL — ABNORMAL HIGH (ref 0–99)
NonHDL: 126.93
Total CHOL/HDL Ratio: 4
Triglycerides: 137 mg/dL (ref 0.0–149.0)
VLDL: 27.4 mg/dL (ref 0.0–40.0)

## 2020-11-27 LAB — COMPREHENSIVE METABOLIC PANEL
ALT: 8 U/L (ref 0–35)
AST: 13 U/L (ref 0–37)
Albumin: 4.2 g/dL (ref 3.5–5.2)
Alkaline Phosphatase: 53 U/L (ref 39–117)
BUN: 14 mg/dL (ref 6–23)
CO2: 28 mEq/L (ref 19–32)
Calcium: 9.3 mg/dL (ref 8.4–10.5)
Chloride: 105 mEq/L (ref 96–112)
Creatinine, Ser: 0.89 mg/dL (ref 0.40–1.20)
GFR: 80.16 mL/min (ref >60.00)
Glucose, Bld: 95 mg/dL (ref 70–99)
Potassium: 3.8 mEq/L (ref 3.5–5.1)
Sodium: 140 mEq/L (ref 135–145)
Total Bilirubin: 0.3 mg/dL (ref 0.2–1.2)
Total Protein: 6.7 g/dL (ref 6.0–8.3)

## 2020-11-27 LAB — TSH: TSH: 1.1 u[IU]/mL (ref 0.35–4.50)

## 2020-11-27 NOTE — Progress Notes (Signed)
Established Patient Office Visit  Subjective:  Patient ID: Erin [name], female    DOB: 1980/12/01  Age: 40 y.o. MRN: [id number]  CC:  Chief Complaint  Patient presents with   Annual Exam   Mass    She has concerns about a spot on the left side of her back. It has been there for 1 month. She denies pain or drainage.     HPI Erin [name] presents for CPX. She is under the care of GYN for female well care. She has a mammogram scheduled for October. She had a mammogram that was abnormal (calcifications), suggesting it be repeated in 6 months. She would like to reduce her weight. Reports exercising that tends to be more strength training. Not much cardio.   Also has concerns of a rash to the left side of her torso that has been present x 1 month. She has been treating it with OTC antifungal cream and it has improved but not completely gone. It is slightly itchy and round. Patient also has concerns of an ant bite that she sustained 3 days go that left her right upper thigh red, swollen and itchy. She has been taking Claritin that has helped. She is concerned that her allergies to insects is becoming more severe. She has an allergy to bee venom but has never had SOB, chest pain or wheezing. However, she reports she has not been stung recently.    Past Medical History:  Diagnosis Date   Depression    PPD NO MEDS   History of concussion 04/19/2017   HPV in female    Vaginal Pap smear, abnormal     Past Surgical History:  Procedure Laterality Date   NO PAST SURGERIES      Family History  Problem Relation Age of Onset   High Cholesterol Mother    Hypertension Mother    Hyperlipidemia Mother    Obesity Father    Anxiety disorder Sister    Depression Sister    Heart murmur Brother    Macular degeneration Maternal Grandfather    Breast cancer Paternal Grandmother 65   Stroke Paternal Grandfather    Breast cancer Maternal Aunt     Social History   Socioeconomic History    Marital status: Married    Spouse name: Not on file   Number of children: Not on file   Years of education: Not on file   Highest education level: Not on file  Occupational History   Not on file  Tobacco Use   Smoking status: Never   Smokeless tobacco: Never  Vaping Use   Vaping Use: Never used  Substance and Sexual Activity   Alcohol use: Yes    Alcohol/week: 1.0 standard drink    Types: 1 Glasses of wine per week   Drug use: Never   Sexual activity: Yes    Partners: Male    Birth control/protection: Surgical    Comment: husband has had vasectomy  Other Topics Concern   Not on file  Social History Narrative   Has 47 mo old infant and 59 yo son      Social Determinants of Radio broadcast assistant Strain: Not on file  Food Insecurity: Not on file  Transportation Needs: Not on file  Physical Activity: Not on file  Stress: Not on file  Social Connections: Not on file  Intimate Partner Violence: Not on file    Outpatient Medications Prior to Visit  Medication Sig Dispense Refill  Cholecalciferol (VITAMIN D) 50 MCG (2000 UT) tablet Take 2,000 Units by mouth daily.     Multiple Vitamin (MULTI-VITAMIN) tablet Take 1 tablet by mouth daily.     SUMAtriptan (IMITREX) 25 MG tablet TAKE 1 TABLET BY MOUTH AS NEEDED FOR MIGRAINE. MAY REPEAT IN 2 HOURS IF HEADACHE PERSISTS OR RECURS. 9 tablet 1   acetaminophen (TYLENOL) 500 MG tablet Take 1,000 mg by mouth as needed. (Patient not taking: Reported on 11/26/2020)     ibuprofen (ADVIL) 200 MG tablet Take by mouth as needed. (Patient not taking: Reported on 11/26/2020)     naproxen (NAPROSYN) 500 MG tablet Take 1,000 mg by mouth as needed. (Patient not taking: Reported on 11/26/2020)     No facility-administered medications prior to visit.    No Known Allergies  ROS Review of Systems  Constitutional: Negative.   HENT: Negative.    Eyes: Negative.   Respiratory: Negative.    Cardiovascular: Negative.   Gastrointestinal:  Negative.   Endocrine: Negative.   Musculoskeletal: Negative.   Skin:  Positive for rash.       Left torso, circle rash  Allergic/Immunologic: Positive for environmental allergies.       Bees and ants   Neurological: Negative.   Hematological: Negative.   Psychiatric/Behavioral: Negative.    All other systems reviewed and are negative.    Objective:    Physical Exam Vitals and nursing note reviewed.  Constitutional:      Appearance: Normal appearance. She is normal weight.  HENT:     Head: Normocephalic and atraumatic.     Right Ear: Tympanic membrane and ear canal normal.     Left Ear: Tympanic membrane and ear canal normal.     Nose: Nose normal.     Mouth/Throat:     Mouth: Mucous membranes are moist.  Eyes:     Extraocular Movements: Extraocular movements intact.     Pupils: Pupils are equal, round, and reactive to light.  Cardiovascular:     Rate and Rhythm: Normal rate and regular rhythm.  Pulmonary:     Effort: Pulmonary effort is normal.     Breath sounds: Normal breath sounds.  Abdominal:     General: Abdomen is flat.  Genitourinary:    Comments: Deferred to GYN Musculoskeletal:        General: Normal range of motion.     Cervical back: Normal range of motion and neck supple.  Skin:    General: Skin is warm and dry.     Findings: Rash present.     Comments: Round, papular rash noted to the left torso that is well defined with central clearing. Approximately the size of a quarter  Neurological:     General: No focal deficit present.     Mental Status: She is alert and oriented to person, place, and time.  Psychiatric:        Mood and Affect: Mood normal.        Behavior: Behavior normal.    BP 100/68 (BP Location: Right Arm, Patient Position: Sitting, Cuff Size: Normal)   Pulse 74   Temp 98.1 F (36.7 C) (Temporal)   Ht 5\' 8"  (1.727 m)   Wt 169 lb 6.4 oz (76.8 kg)   LMP 11/20/2020 (Exact Date)   SpO2 99%   Breastfeeding No   BMI 25.76 kg/m  Wt  Readings from Last 3 Encounters:  11/26/20 169 lb 6.4 oz (76.8 kg)  02/15/20 164 lb 3.2 oz (74.5 kg)  09/26/19 165  lb (74.8 kg)     Health Maintenance Due  Topic Date Due   Pneumococcal Vaccine 10-15 Years old (1 - PCV) Never done   Hepatitis C Screening  Never done   PAP SMEAR-Modifier  Never done    There are no preventive care reminders to display for this patient.  Lab Results  Component Value Date   TSH 1.69 01/28/2018   Lab Results  Component Value Date   WBC 12.6 (H) 12/02/2018   HGB 9.7 (L) 12/02/2018   HCT 31.2 (L) 12/02/2018   MCV 78.2 (L) 12/02/2018   PLT 269 12/02/2018   Lab Results  Component Value Date   NA 140 01/28/2018   K 4.2 01/28/2018   CO2 29 01/28/2018   GLUCOSE 73 01/28/2018   BUN 14 01/28/2018   CREATININE 0.86 01/28/2018   BILITOT 0.6 01/28/2018   ALKPHOS 43 01/28/2018   AST 11 01/28/2018   ALT 8 01/28/2018   PROT 6.8 01/28/2018   ALBUMIN 4.3 01/28/2018   CALCIUM 9.5 01/28/2018   GFR 77.98 01/28/2018   Lab Results  Component Value Date   CHOL 178 01/28/2018   Lab Results  Component Value Date   HDL 44.10 01/28/2018   Lab Results  Component Value Date   LDLCALC 105 (H) 01/28/2018   Lab Results  Component Value Date   TRIG 143.0 01/28/2018   Lab Results  Component Value Date   CHOLHDL 4 01/28/2018   No results found for: HGBA1C    Assessment & Plan:   Problem List Items Addressed This Visit   None Visit Diagnoses     Screening for endocrine, metabolic and immunity disorder    -  Primary   Relevant Orders   TSH   Routine general medical examination at a health care facility       Relevant Orders   CBC with Differential   Comprehensive metabolic panel   Screening for lipoid disorders       Relevant Orders   Lipid panel   Tinea corporis       Relevant Medications   miconazole (MICATIN) 2 % cream   Allergic reaction to insect bite           Meds ordered this encounter  Medications   miconazole (MICATIN) 2  % cream    Sig: Apply 1 application topically 2 (two) times daily.    Dispense:  28.35 g    Refill:  0   EPINEPHrine (EPIPEN 2-PAK) 0.3 mg/0.3 mL IJ SOAJ injection    Sig: Inject 0.3 mg into the muscle as needed for anaphylaxis.    Dispense:  1 each    Refill:  0    Follow-up with mammogram as scheduled. Encouraged more cardiovascular exercise. Miconazole for ringworm. Epi-pen with instructions on use also provided in the event of a severe allergic reaction. Encouraged a healthy diet.   Follow-up: Return in 1 year (on 11/26/2021).    Kennyth Arnold, FNP

## 2020-12-24 ENCOUNTER — Telehealth: Payer: Self-pay

## 2020-12-24 NOTE — Telephone Encounter (Signed)
Patient seen by Abby Potash last month for CPE and given cream for a ringworm. States that it has not gotten any better. Wanting to know if she should continue cream or needs f/u. Please advise

## 2020-12-25 NOTE — Telephone Encounter (Signed)
Please schedule a follow up in office for eval, since it has been a month.

## 2020-12-25 NOTE — Telephone Encounter (Signed)
Patient has been scheduled

## 2021-01-02 ENCOUNTER — Encounter: Payer: Self-pay | Admitting: Family

## 2021-01-02 ENCOUNTER — Other Ambulatory Visit: Payer: Self-pay

## 2021-01-02 ENCOUNTER — Ambulatory Visit (INDEPENDENT_AMBULATORY_CARE_PROVIDER_SITE_OTHER): Payer: BC Managed Care – PPO | Admitting: Family

## 2021-01-02 VITALS — BP 120/76 | HR 74 | Temp 98.5°F | Ht 68.0 in | Wt 164.4 lb

## 2021-01-02 DIAGNOSIS — B354 Tinea corporis: Secondary | ICD-10-CM

## 2021-01-02 MED ORDER — FLUCONAZOLE 150 MG PO TABS: 150.0000 mg | ORAL_TABLET | ORAL | 0 refills | Status: DC

## 2021-01-02 NOTE — Patient Instructions (Signed)
Body Ringworm Body ringworm is an infection of the skin that often causes a ring-shaped rash.Body ringworm is also called tinea corporis. Body ringworm can affect any part of your skin. This condition is easily spread from person to person (is very contagious). What are the causes? This condition is caused by fungi called dermatophytes. The condition developswhen these fungi grow out of control on the skin. You can get this condition if you touch a person or animal that has it. You can also get it if you share any items with an infected person or pet. These include: Clothing, bedding, and towels. Brushes or combs. Gym equipment. Any other object that has the fungus on it. What increases the risk? You are more likely to develop this condition if you: Play sports that involve close physical contact, such as wrestling. Sweat a lot. Live in areas that are hot and humid. Use public showers. Have a weakened immune system. What are the signs or symptoms? Symptoms of this condition include: Itchy, raised red spots and bumps. Red scaly patches. A ring-shaped rash. The rash may have: A clear center. Scales or red bumps at its center. Redness near its borders. Dry and scaly skin on or around it. How is this diagnosed? This condition can usually be diagnosed with a skin exam. A skin scraping may be taken from the affected area and examined under a microscope to see if thefungus is present. How is this treated? This condition may be treated with: An antifungal cream or ointment. An antifungal shampoo. Antifungal medicines. These may be prescribed if your ringworm: Is severe. Keeps coming back. Lasts a long time. Follow these instructions at home: Take over-the-counter and prescription medicines only as told by your health care provider. If you were given an antifungal cream or ointment: Use it as told by your health care provider. Wash the infected area and dry it completely before  applying the cream or ointment. If you were given an antifungal shampoo: Use it as told by your health care provider. Leave the shampoo on your body for 3-5 minutes before rinsing. While you have a rash: Wear loose clothing to stop clothes from rubbing and irritating it. Wash or change your bed sheets every night. Disinfect or throw out items that may be infected. Wash clothes and bed sheets in hot water. Wash your hands often with soap and water. If soap and water are not available, use hand sanitizer. If your pet has the same infection, take your pet to see a veterinarian for treatment. How is this prevented? Take a bath or shower every day and after every time you work out or play sports. Dry your skin completely after bathing. Wear sandals or shoes in public places and showers. Change your clothes every day. Wash athletic clothes after each use. Do not share personal items with others. Avoid touching red patches of skin on other people. Avoid touching pets that have bald spots. If you touch an animal that has a bald spot, wash your hands. Contact a health care provider if: Your rash continues to spread after 7 days of treatment. Your rash is not gone in 4 weeks. The area around your rash gets red, warm, tender, and swollen. Summary Body ringworm is an infection of the skin that often causes a ring-shaped rash. This condition is easily spread from person to person (is very contagious). This condition may be treated with antifungal cream or ointment, antifungal shampoo, or antifungal medicines. Take over-the-counter and prescription medicines only  as told by your health care provider. This information is not intended to replace advice given to you by your health care provider. Make sure you discuss any questions you have with your healthcare provider. Document Revised: 01/21/2018 Document Reviewed: 01/21/2018 Elsevier Patient Education  [place].

## 2021-01-02 NOTE — Progress Notes (Signed)
Acute Office Visit  Subjective:    Patient ID: Erin [name], female    DOB: 08-03-1980, 40 y.o.   MRN: [id number]  Chief Complaint  Patient presents with  . Tinea    Pt says that cream is not working, there has been no changes. Still has not subsided.    HPI Patient is in today with concerns of persistent and ringworm to her left torso.  She has been applying antifungal cream that has not helped.  Denies any worsening of the symptoms.  However, resolution has not occurred.  Past Medical History:  Diagnosis Date  . Depression    PPD NO MEDS  . History of concussion 04/19/2017  . HPV in female   . Vaginal Pap smear, abnormal     Past Surgical History:  Procedure Laterality Date  . NO PAST SURGERIES      Family History  Problem Relation Age of Onset  . High Cholesterol Mother   . Hypertension Mother   . Hyperlipidemia Mother   . Obesity Father   . Anxiety disorder Sister   . Depression Sister   . Heart murmur Brother   . Macular degeneration Maternal Grandfather   . Breast cancer Paternal Grandmother 55  . Stroke Paternal Grandfather   . Breast cancer Maternal Aunt     Social History   Socioeconomic History  . Marital status: Married    Spouse name: Not on file  . Number of children: Not on file  . Years of education: Not on file  . Highest education level: Not on file  Occupational History  . Not on file  Tobacco Use  . Smoking status: Never  . Smokeless tobacco: Never  Vaping Use  . Vaping Use: Never used  Substance and Sexual Activity  . Alcohol use: Yes    Alcohol/week: 1.0 standard drink    Types: 1 Glasses of wine per week  . Drug use: Never  . Sexual activity: Yes    Partners: Male    Birth control/protection: Surgical    Comment: husband has had vasectomy  Other Topics Concern  . Not on file  Social History Narrative   Has 25 mo old infant and 60 yo son      Social Determinants of Radio broadcast assistant Strain: Not on file  Food  Insecurity: Not on file  Transportation Needs: Not on file  Physical Activity: Not on file  Stress: Not on file  Social Connections: Not on file  Intimate Partner Violence: Not on file    Outpatient Medications Prior to Visit  Medication Sig Dispense Refill  . Cholecalciferol (VITAMIN D) 50 MCG (2000 UT) tablet Take 2,000 Units by mouth daily.    Marland Kitchen EPINEPHrine (EPIPEN 2-PAK) 0.3 mg/0.3 mL IJ SOAJ injection Inject 0.3 mg into the muscle as needed for anaphylaxis. 1 each 0  . miconazole (MICATIN) 2 % cream Apply 1 application topically 2 (two) times daily. 28.35 g 0  . Multiple Vitamin (MULTI-VITAMIN) tablet Take 1 tablet by mouth daily.    . SUMAtriptan (IMITREX) 25 MG tablet TAKE 1 TABLET BY MOUTH AS NEEDED FOR MIGRAINE. MAY REPEAT IN 2 HOURS IF HEADACHE PERSISTS OR RECURS. 9 tablet 1  . acetaminophen (TYLENOL) 500 MG tablet Take 1,000 mg by mouth as needed. (Patient not taking: No sig reported)    . ibuprofen (ADVIL) 200 MG tablet Take by mouth as needed. (Patient not taking: No sig reported)    . naproxen (NAPROSYN) 500 MG tablet  Take 1,000 mg by mouth as needed. (Patient not taking: No sig reported)     No facility-administered medications prior to visit.    No Known Allergies  Review of Systems  Constitutional: Negative.   Respiratory: Negative.    Cardiovascular: Negative.   Skin:  Positive for rash.       Fungal skin rash to the left torso  Allergic/Immunologic: Negative.   Psychiatric/Behavioral: Negative.        Objective:    Physical Exam Vitals and nursing note reviewed.  Constitutional:      Appearance: Normal appearance.  Cardiovascular:     Rate and Rhythm: Normal rate and regular rhythm.  Pulmonary:     Effort: Pulmonary effort is normal.     Breath sounds: Normal breath sounds.  Abdominal:     General: Abdomen is flat.     Palpations: Abdomen is soft.  Musculoskeletal:     Cervical back: Normal range of motion and neck supple.  Skin:    Findings: Rash  present.     Comments: 2 cm round, papular rash with central clearing noted to the left torso.  No redness or irritation.  Psychiatric:        Mood and Affect: Mood normal.        Behavior: Behavior normal.   BP 120/76   Pulse 74   Temp 98.5 F (36.9 C) (Temporal)   Ht '5\' 8"'$  (1.727 m)   Wt 164 lb 6.4 oz (74.6 kg)   SpO2 99%   BMI 25.00 kg/m  Wt Readings from Last 3 Encounters:  01/02/21 164 lb 6.4 oz (74.6 kg)  11/26/20 169 lb 6.4 oz (76.8 kg)  02/15/20 164 lb 3.2 oz (74.5 kg)    Health Maintenance Due  Topic Date Due  . Hepatitis C Screening  Never done  . PAP SMEAR-Modifier  Never done    There are no preventive care reminders to display for this patient.   Lab Results  Component Value Date   TSH 1.10 11/26/2020   Lab Results  Component Value Date   WBC 11.8 (H) 11/26/2020   HGB 12.3 11/26/2020   HCT 37.0 11/26/2020   MCV 80.8 11/26/2020   PLT 347.0 11/26/2020   Lab Results  Component Value Date   NA 140 11/26/2020   K 3.8 11/26/2020   CO2 28 11/26/2020   GLUCOSE 95 11/26/2020   BUN 14 11/26/2020   CREATININE 0.89 11/26/2020   BILITOT 0.3 11/26/2020   ALKPHOS 53 11/26/2020   AST 13 11/26/2020   ALT 8 11/26/2020   PROT 6.7 11/26/2020   ALBUMIN 4.2 11/26/2020   CALCIUM 9.3 11/26/2020   GFR 80.16 11/26/2020   Lab Results  Component Value Date   CHOL 170 11/26/2020   Lab Results  Component Value Date   HDL 42.80 11/26/2020   Lab Results  Component Value Date   LDLCALC 100 (H) 11/26/2020   Lab Results  Component Value Date   TRIG 137.0 11/26/2020   Lab Results  Component Value Date   CHOLHDL 4 11/26/2020   No results found for: HGBA1C     Assessment & Plan:   Problem List Items Addressed This Visit   None Visit Diagnoses     Tinea corporis    -  Primary   Relevant Medications   fluconazole (DIFLUCAN) 150 MG tablet        Meds ordered this encounter  Medications  . fluconazole (DIFLUCAN) 150 MG tablet    Sig: Take  1 tablet  (150 mg total) by mouth once a week.    Dispense:  4 tablet    Refill:  0   Plan: Topical antifungal twice a day.  Diflucan once weekly x2 weeks.  Call the office if symptoms worsen or persist.  Recheck as scheduled and sooner as needed.  Kennyth Arnold, FNP

## 2021-01-13 DIAGNOSIS — D225 Melanocytic nevi of trunk: Secondary | ICD-10-CM | POA: Diagnosis not present

## 2021-01-13 DIAGNOSIS — D2261 Melanocytic nevi of right upper limb, including shoulder: Secondary | ICD-10-CM | POA: Diagnosis not present

## 2021-01-13 DIAGNOSIS — L920 Granuloma annulare: Secondary | ICD-10-CM | POA: Diagnosis not present

## 2021-01-13 DIAGNOSIS — D2262 Melanocytic nevi of left upper limb, including shoulder: Secondary | ICD-10-CM | POA: Diagnosis not present

## 2021-01-29 DIAGNOSIS — L255 Unspecified contact dermatitis due to plants, except food: Secondary | ICD-10-CM | POA: Diagnosis not present

## 2021-03-22 DIAGNOSIS — Z20822 Contact with and (suspected) exposure to covid-19: Secondary | ICD-10-CM | POA: Diagnosis not present

## 2021-04-29 ENCOUNTER — Other Ambulatory Visit: Payer: Self-pay | Admitting: Obstetrics and Gynecology

## 2021-04-29 ENCOUNTER — Other Ambulatory Visit: Payer: Self-pay | Admitting: Family Medicine

## 2021-04-29 ENCOUNTER — Ambulatory Visit
Admission: RE | Admit: 2021-04-29 | Discharge: 2021-04-29 | Disposition: A | Discharge status: Home / Self Care | Payer: BC Managed Care – PPO | Source: Ambulatory Visit | Attending: Obstetrics and Gynecology | Admitting: Obstetrics and Gynecology

## 2021-04-29 ENCOUNTER — Other Ambulatory Visit: Payer: Self-pay

## 2021-04-29 DIAGNOSIS — R921 Mammographic calcification found on diagnostic imaging of breast: Secondary | ICD-10-CM

## 2021-04-29 DIAGNOSIS — R922 Inconclusive mammogram: Secondary | ICD-10-CM | POA: Diagnosis not present

## 2021-06-17 DIAGNOSIS — Z6825 Body mass index (BMI) 25.0-25.9, adult: Secondary | ICD-10-CM | POA: Diagnosis not present

## 2021-06-17 DIAGNOSIS — Z01419 Encounter for gynecological examination (general) (routine) without abnormal findings: Secondary | ICD-10-CM | POA: Diagnosis not present

## 2021-06-17 LAB — HM PAP SMEAR

## 2021-06-17 LAB — RESULTS CONSOLE HPV: CHL HPV: NEGATIVE

## 2021-07-08 ENCOUNTER — Other Ambulatory Visit: Payer: Self-pay | Admitting: Family Medicine

## 2021-10-28 ENCOUNTER — Ambulatory Visit
Admission: RE | Admit: 2021-10-28 | Discharge: 2021-10-28 | Disposition: A | Discharge status: Home / Self Care | Payer: BC Managed Care – PPO | Source: Ambulatory Visit | Attending: Obstetrics and Gynecology | Admitting: Obstetrics and Gynecology

## 2021-10-28 DIAGNOSIS — R921 Mammographic calcification found on diagnostic imaging of breast: Secondary | ICD-10-CM | POA: Diagnosis not present

## 2021-11-08 ENCOUNTER — Other Ambulatory Visit: Payer: Self-pay | Admitting: Family Medicine

## 2021-12-13 ENCOUNTER — Other Ambulatory Visit: Payer: Self-pay | Admitting: Physician Assistant

## 2022-01-14 DIAGNOSIS — D2261 Melanocytic nevi of right upper limb, including shoulder: Secondary | ICD-10-CM | POA: Diagnosis not present

## 2022-01-14 DIAGNOSIS — L821 Other seborrheic keratosis: Secondary | ICD-10-CM | POA: Diagnosis not present

## 2022-01-14 DIAGNOSIS — L920 Granuloma annulare: Secondary | ICD-10-CM | POA: Diagnosis not present

## 2022-01-14 DIAGNOSIS — D225 Melanocytic nevi of trunk: Secondary | ICD-10-CM | POA: Diagnosis not present

## 2022-01-16 ENCOUNTER — Other Ambulatory Visit: Payer: Self-pay | Admitting: Physician Assistant

## 2022-01-20 ENCOUNTER — Encounter: Payer: Self-pay | Admitting: Physician Assistant

## 2022-01-20 ENCOUNTER — Ambulatory Visit (INDEPENDENT_AMBULATORY_CARE_PROVIDER_SITE_OTHER): Payer: BC Managed Care – PPO | Admitting: Physician Assistant

## 2022-01-20 VITALS — BP 100/68 | HR 68 | Temp 98.0°F | Ht 68.0 in | Wt 170.4 lb

## 2022-01-20 DIAGNOSIS — R591 Generalized enlarged lymph nodes: Secondary | ICD-10-CM | POA: Diagnosis not present

## 2022-01-20 NOTE — Progress Notes (Signed)
Erin [name] is a 41 y.o. female here for a new problem.  History of Present Illness:   Chief Complaint  Patient presents with   Lymphadenopathy    Pt c/o swollen glands right side of neck, started on Saturday, tender to touch. Denies fever or chills.    HPI  Lymphadenopathy Patient developed swollen gland on the R side of her neck on Saturday. Was present on waking up. No recent dental work, infections, fever, chills, allergies, ear pain, travel, night sweats, fatigue, body aches.  Taking ibuprofen and tylenol twice daily  Past Medical History:  Diagnosis Date   Depression    PPD NO MEDS   History of concussion 04/19/2017   HPV in female    Vaginal Pap smear, abnormal      Social History   Tobacco Use   Smoking status: Never   Smokeless tobacco: Never  Vaping Use   Vaping Use: Never used  Substance Use Topics   Alcohol use: Yes    Alcohol/week: 1.0 standard drink of alcohol    Types: 1 Glasses of wine per week   Drug use: Never    Past Surgical History:  Procedure Laterality Date   NO PAST SURGERIES      Family History  Problem Relation Age of Onset   High Cholesterol Mother    Hypertension Mother    Hyperlipidemia Mother    Obesity Father    Anxiety disorder Sister    Depression Sister    Heart murmur Brother    Macular degeneration Maternal Grandfather    Breast cancer Paternal Grandmother 44   Stroke Paternal Grandfather    Breast cancer Maternal Aunt     No Known Allergies  Current Medications:   Current Outpatient Medications:    acetaminophen (TYLENOL) 500 MG tablet, Take 1,000 mg by mouth as needed., Disp: , Rfl:    EPINEPHrine (EPIPEN 2-PAK) 0.3 mg/0.3 mL IJ SOAJ injection, Inject 0.3 mg into the muscle as needed for anaphylaxis., Disp: 1 each, Rfl: 0   ibuprofen (ADVIL) 200 MG tablet, Take by mouth as needed., Disp: , Rfl:    Multiple Vitamin (MULTI-VITAMIN) tablet, Take 1 tablet by mouth daily., Disp: , Rfl:    naproxen (NAPROSYN) 500  MG tablet, Take 1,000 mg by mouth as needed., Disp: , Rfl:    SUMAtriptan (IMITREX) 25 MG tablet, TAKE 1 TABLET BY MOUTH AS NEEDED FOR MIGRAINE. MAY REPEAT IN 2 HOURS IF HEADACHE PERSISTS OR RECURS., Disp: 9 tablet, Rfl: 0   Review of Systems:   ROS Negative unless otherwise specified per HPI.  Vitals:   Vitals:   01/20/22 0939  BP: 100/68  Pulse: 68  Temp: 98 F (36.7 C)  TempSrc: Temporal  SpO2: 97%  Weight: 170 lb 6.1 oz (77.3 kg)  Height: '5\' 8"'$  (1.727 m)     Body mass index is 25.91 kg/m.  Physical Exam:   Physical Exam Vitals and nursing note reviewed.  Constitutional:      General: She is not in acute distress.    Appearance: She is well-developed. She is not ill-appearing or toxic-appearing.  HENT:     Head: Normocephalic and atraumatic.     Right Ear: Tympanic membrane, ear canal and external ear normal. Tympanic membrane is not erythematous, retracted or bulging.     Left Ear: Tympanic membrane, ear canal and external ear normal. Tympanic membrane is not erythematous, retracted or bulging.     Nose: Nose normal.     Right Sinus: No  maxillary sinus tenderness or frontal sinus tenderness.     Left Sinus: No maxillary sinus tenderness or frontal sinus tenderness.     Mouth/Throat:     Pharynx: Uvula midline. No posterior oropharyngeal erythema.  Eyes:     General: Lids are normal.     Conjunctiva/sclera: Conjunctivae normal.  Neck:     Trachea: Trachea normal.  Cardiovascular:     Rate and Rhythm: Normal rate and regular rhythm.     Pulses: Normal pulses.     Heart sounds: Normal heart sounds, S1 normal and S2 normal.  Pulmonary:     Effort: Pulmonary effort is normal.     Breath sounds: Normal breath sounds. No decreased breath sounds, wheezing, rhonchi or rales.  Lymphadenopathy:     Cervical: Cervical adenopathy present.     Right cervical: Superficial cervical adenopathy present.  Skin:    General: Skin is warm and dry.  Neurological:     Mental  Status: She is alert.     GCS: GCS eye subscore is 4. GCS verbal subscore is 5. GCS motor subscore is 6.  Psychiatric:        Speech: Speech normal.        Behavior: Behavior normal. Behavior is cooperative.     Assessment and Plan:   Lymphadenopathy No red flags Suspect reactive lymphnode Continue ibuprofen/tylenol Follow-up if new/worsening Will proceed with imaging and/or blood work if still present after 4 weeks; or if new sx develop  Inda Coke, PA-C

## 2022-01-29 ENCOUNTER — Encounter: Payer: Self-pay | Admitting: Physician Assistant

## 2022-03-02 ENCOUNTER — Encounter: Payer: Self-pay | Admitting: *Deleted

## 2022-03-29 ENCOUNTER — Emergency Department (HOSPITAL_COMMUNITY)
Admission: EM | Admit: 2022-03-29 | Discharge: 2022-03-30 | Disposition: A | Discharge status: Home / Self Care | Payer: BC Managed Care – PPO | Attending: Emergency Medicine | Admitting: Emergency Medicine

## 2022-03-29 ENCOUNTER — Other Ambulatory Visit: Payer: Self-pay

## 2022-03-29 ENCOUNTER — Encounter (HOSPITAL_COMMUNITY): Payer: Self-pay | Admitting: *Deleted

## 2022-03-29 DIAGNOSIS — T7840XA Allergy, unspecified, initial encounter: Secondary | ICD-10-CM | POA: Insufficient documentation

## 2022-03-29 MED ORDER — PREDNISONE 20 MG PO TABS
60.0000 mg | ORAL_TABLET | Freq: Once | ORAL | Status: AC
  Administered 2022-03-29: 60 mg via ORAL
  Filled 2022-03-29: qty 3

## 2022-03-29 MED ORDER — FAMOTIDINE 20 MG PO TABS
20.0000 mg | ORAL_TABLET | Freq: Once | ORAL | Status: AC
  Administered 2022-03-29: 20 mg via ORAL
  Filled 2022-03-29: qty 1

## 2022-03-29 MED ORDER — DIPHENHYDRAMINE HCL 25 MG PO CAPS
25.0000 mg | ORAL_CAPSULE | Freq: Once | ORAL | Status: AC
  Administered 2022-03-29: 25 mg via ORAL
  Filled 2022-03-29: qty 1

## 2022-03-29 NOTE — ED Triage Notes (Signed)
Pt states that since she woke up this morning, she has had itching in her mouth and lips, in addition having mild lower lip swelling and redness. Denies rash. Only allergy is to poison ivy, only new thing was that she had a mango beer yesterday. She took 1 zyrtec about 1 hour ago. Denies difficulty swallowing, breathing, no drooling or obvious swelling in her throat. Lung sounds clear, no distress.

## 2022-03-29 NOTE — ED Provider Triage Note (Signed)
  Emergency Medicine Provider Triage Evaluation Note  MRN:  [id number]  Arrival date & time: 03/29/22    Medically screening exam initiated at 11:11 PM.   CC:   Allergic Reaction   HPI:  Erin [name] is a 41 y.o. year-old female presents to the ED with chief complaint of allergic reaction.  Had a mango beer and woke up with itchy sensation and tingling of the lips.  Took zyrtec.  Denies SOB, n/v/d.  History provided by patient. ROS:  -As included in HPI PE:   Vitals:   03/29/22 2234  BP: 114/69  Pulse: 65  Resp: 16  Temp: 98 F (36.7 C)  SpO2: 100%    Non-toxic appearing No respiratory distress  MDM:  Based on signs and symptoms, allergic reaction is highest on my differential. I've ordered meds in triage to expedite lab/diagnostic workup.  Patient was informed that the remainder of the evaluation will be completed by another provider, this initial triage assessment does not replace that evaluation, and the importance of remaining in the ED until their evaluation is complete.    Montine Circle, PA-C 03/29/22 2313

## 2022-03-30 ENCOUNTER — Telehealth: Payer: Self-pay | Admitting: Physician Assistant

## 2022-03-30 ENCOUNTER — Other Ambulatory Visit: Payer: Self-pay | Admitting: Physician Assistant

## 2022-03-30 MED ORDER — FAMOTIDINE 20 MG PO TABS: 20.0000 mg | ORAL_TABLET | Freq: Two times a day (BID) | ORAL | 0 refills | Status: DC

## 2022-03-30 MED ORDER — PREDNISONE 20 MG PO TABS: 40.0000 mg | ORAL_TABLET | Freq: Every day | ORAL | 0 refills | Status: DC

## 2022-03-30 NOTE — Telephone Encounter (Signed)
Pt was seen at [facility] on 03/29/22  Patient Name: Erin [name] Gender: Female DOB: [birth year] Age: 41 Y 57 M 25 D Return Phone Number: [phone] (Primary), [phone] (Secondary) Address: City/ State/ Zip: [place]  [address] Client [facility] [place] Client Site [place] [facility] [place] Night Provider Inda Coke- [place] Contact Type Call Who Is Calling Patient / Member / Family / Caregiver Call Type Triage / Clinical Relationship To Patient Self Return Phone Number [phone] (Primary) Chief Complaint Itching Reason for Call Symptomatic / Request for [name] states that she has an allergy to poison ivy, last night she had a beer that had mango in it, she believes she is having an allergic reaction as her mouth and lips are itching today. Translation No Nurse Assessment Nurse: Lissa Hoard, RN, Colletta [place] Date/Time Eilene Ghazi Time): 03/29/2022 9:59:56 PM Confirm and document reason for call. If symptomatic, describe symptoms. ---Caller states that she has an allergy to poison ivy, last night she had a beer that had mango in it, she believes she is having an allergic reaction as her mouth and lips are itching today from what she has read online; denies other symptoms Does the patient have any new or worsening symptoms? ---Yes Will a triage be completed? ---Yes Related visit to physician within the last 2 weeks? ---No Does the PT have any chronic conditions? (i.e. diabetes, asthma, this includes High risk factors for pregnancy, etc.) ---No Is the patient pregnant or possibly pregnant? (Ask all females between the ages of 55-55) ---No Is this a behavioral health or substance abuse call? ---No Guidelines Guideline Title Affirmed Question Affirmed Notes Nurse Date/Time (Eastern Time) Lip Swelling [1] Looks infected AND [2] large red area (> 2 in. or 5 cm) Lissa Hoard, RN, Colletta [place] 03/29/2022  10:01:30 PM Disp. Time Eilene Ghazi Time) Disposition Final User 03/29/2022 10:03:27 PM See HCP within 4 Hours (or PCP triage) Yes Lissa Hoard, RN, Colletta [place] Final Disposition 03/29/2022 10:03:27 PM See HCP within 4 Hours (or PCP triage) Yes Lissa Hoard, RN, [place] Newer Disagree/Comply Comply Caller Understands Yes PreDisposition Search internet for information Care Advice Given Per Guideline SEE HCP (OR PCP TRIAGE) WITHIN 4 HOURS: * IF OFFICE WILL BE CLOSED AND NO PCP (PRIMARY CARE PROVIDER) SECOND-LEVEL TRIAGE: You need to be seen within the next 3 or 4 hours. A nearby Urgent Care Center [facility]) is often a good source of care. Another choice is to go to the ED. Go sooner if you become worse. CALL BACK IF: * You become worse CARE ADVICE given per Lip Swelling (Adult) guideline. Referrals [facility] - ED

## 2022-03-30 NOTE — ED Provider Notes (Signed)
[facility] Emergency Department Provider Note MRN:  [id number]  Arrival date & time: 03/30/22     Chief Complaint   Allergic Reaction   History of Present Illness   Erin [name] is a 41 y.o. year-old female presents to the ED with chief complaint of allergic reaction.  States that she had a mango beer last night and woke up with some lip tingling sensation.  She states that she read that mango beer has poison ivy in it and says she's very allergic to poison ivy.  She took some zyrtec without much relief.  She denies n/v/d, SOB, or rash.  History provided by patient.   Review of Systems  Pertinent positive and negative review of systems noted in HPI.    Physical Exam   Vitals:   03/29/22 2234  BP: 114/69  Pulse: 65  Resp: 16  Temp: 98 F (36.7 C)  SpO2: 100%    CONSTITUTIONAL:  non toxic-appearing, NAD NEURO:  Alert and oriented x 3, CN 3-12 grossly intact EYES:  eyes equal and reactive ENT/NECK:  Supple, no stridor, oropharynx is clear, no stridor CARDIO:  normal rate, appears well-perfused  PULM:  No respiratory distress,  GI/GU:  non-distended,  MSK/SPINE:  No gross deformities, no edema, moves all extremities  SKIN:  no rash, atraumatic   *Additional and/or pertinent findings included in MDM below  Diagnostic and Interventional Summary     Labs Reviewed - No data to display  No orders to display    Medications  diphenhydrAMINE (BENADRYL) capsule 25 mg (25 mg Oral Given 03/29/22 2325)  famotidine (PEPCID) tablet 20 mg (20 mg Oral Given 03/29/22 2325)  predniSONE (DELTASONE) tablet 60 mg (60 mg Oral Given 03/29/22 2325)     Procedures  /  Critical Care Procedures  ED Course and Medical Decision Making  I have reviewed the triage vital signs, the nursing notes, and pertinent available records from the EMR.  Social Determinants Affecting Complexity of Care: Patient has no clinically significant social determinants affecting  this chief complaint..   ED Course:    Medical Decision Making Patient here with tingling lips that she thinks is related to drinking mango beer.  No sign of anaphylaxis.  I treated the patient's symptoms with benadryl, prednisone, and pepcid.  She was reassessed and states that she is feeling much better.  I think she appears stable for discharge and outpatient follow-up.    Risk Prescription drug management.     Consultants: No consultations were needed in caring for this patient.   Treatment and Plan: Emergency department workup does not suggest an emergent condition requiring admission or immediate intervention beyond  what has been performed at this time. The patient is safe for discharge and has  been instructed to return immediately for worsening symptoms, change in  symptoms or any other concerns    Final Clinical Impressions(s) / ED Diagnoses     ICD-10-CM   1. Allergic reaction, initial encounter  T78.40XA       ED Discharge Orders          Ordered    predniSONE (DELTASONE) 20 MG tablet  Daily        03/30/22 0054    famotidine (PEPCID) 20 MG tablet  2 times daily        03/30/22 0054              Discharge Instructions Discussed with and Provided to Patient:   Discharge Instructions  You can continue to take Zyrtec or Benadryl for the next few days.      Montine Circle, PA-C 03/30/22 6244    Ripley Fraise, MD 03/31/22 [phone]

## 2022-03-30 NOTE — Discharge Instructions (Signed)
You can continue to take Zyrtec or Benadryl for the next few days.

## 2022-03-30 NOTE — Telephone Encounter (Signed)
FYI, see Triage note, pt went to the ED.

## 2022-04-03 ENCOUNTER — Other Ambulatory Visit: Payer: Self-pay | Admitting: Obstetrics and Gynecology

## 2022-04-03 DIAGNOSIS — R921 Mammographic calcification found on diagnostic imaging of breast: Secondary | ICD-10-CM

## 2022-04-26 ENCOUNTER — Other Ambulatory Visit: Payer: Self-pay | Admitting: Physician Assistant

## 2022-05-21 ENCOUNTER — Encounter: Payer: Self-pay | Admitting: *Deleted

## 2022-06-23 DIAGNOSIS — Z124 Encounter for screening for malignant neoplasm of cervix: Secondary | ICD-10-CM | POA: Diagnosis not present

## 2022-06-23 DIAGNOSIS — Z1151 Encounter for screening for human papillomavirus (HPV): Secondary | ICD-10-CM | POA: Diagnosis not present

## 2022-06-23 DIAGNOSIS — Z01419 Encounter for gynecological examination (general) (routine) without abnormal findings: Secondary | ICD-10-CM | POA: Diagnosis not present

## 2022-06-23 DIAGNOSIS — Z6826 Body mass index (BMI) 26.0-26.9, adult: Secondary | ICD-10-CM | POA: Diagnosis not present

## 2022-08-05 IMAGING — MG MM DIGITAL DIAGNOSTIC UNILAT*L* W/ TOMO W/ CAD
6 series · 6 of 14 positions shown · non-contrast
Comparison: Previous exam(s).

CLINICAL DATA: Six-month follow-up for left breast calcifications.

EXAM:
DIGITAL DIAGNOSTIC UNILATERAL LEFT MAMMOGRAM WITH TOMOSYNTHESIS AND
CAD
TECHNIQUE: Left digital diagnostic mammography and breast tomosynthesis was
performed. The images were evaluated with computer-aided detection.

[L ML]
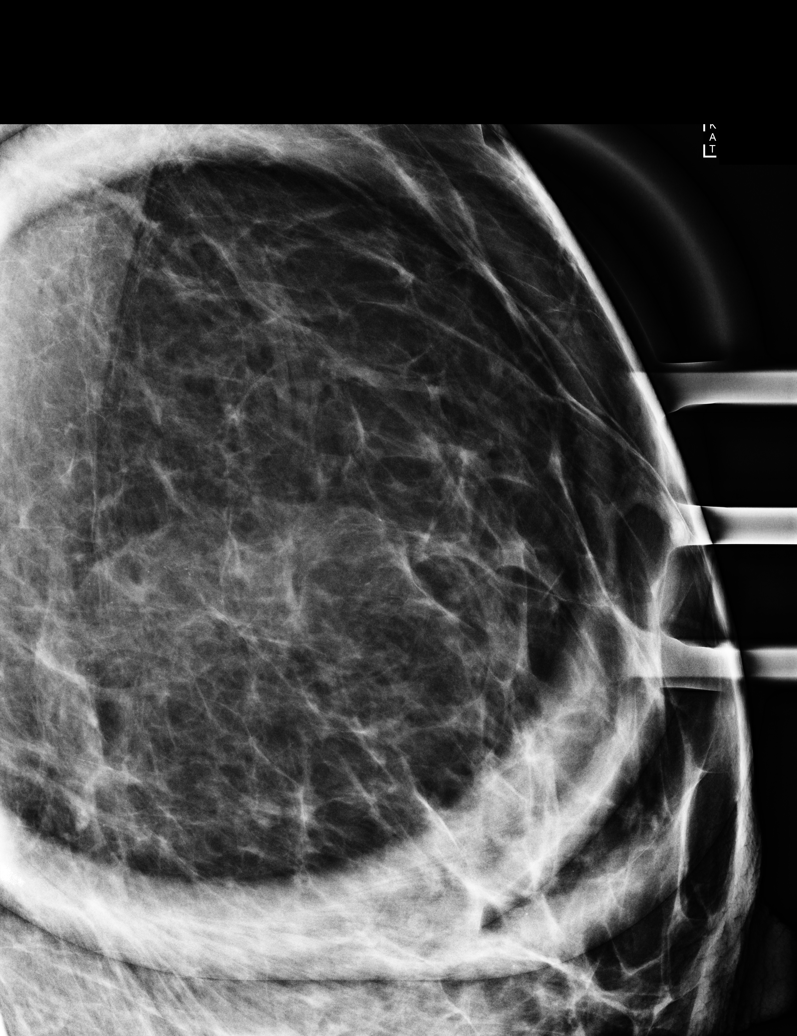

[L CC]
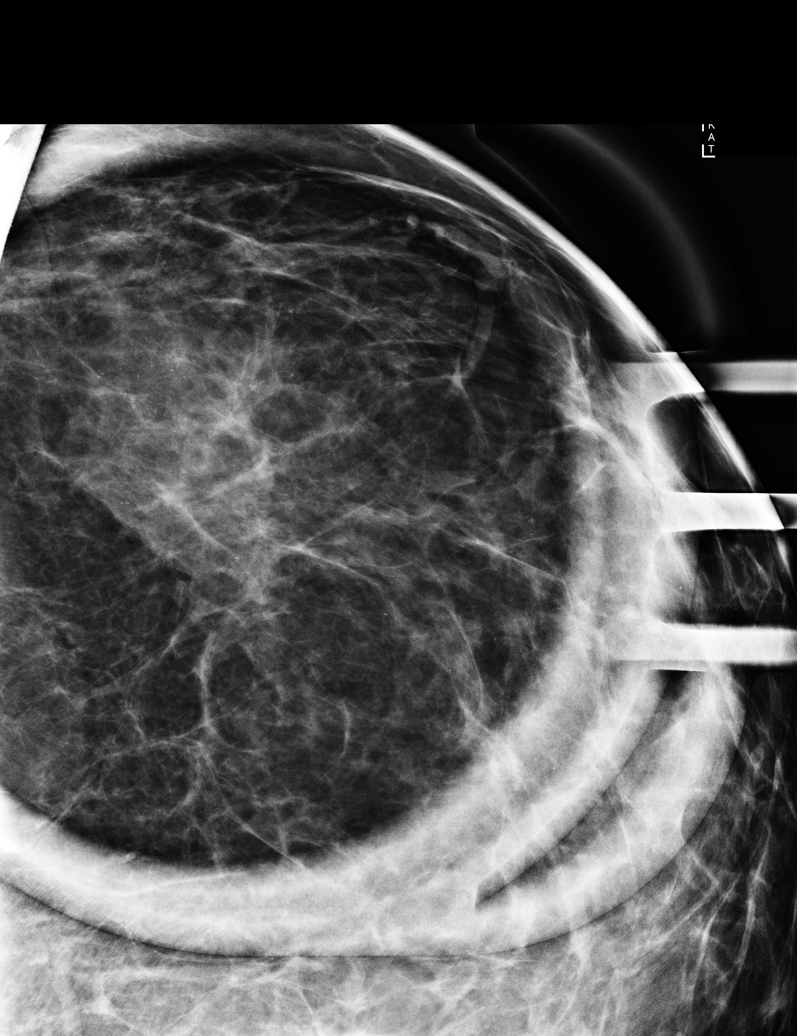

[L MLO synth-2D]
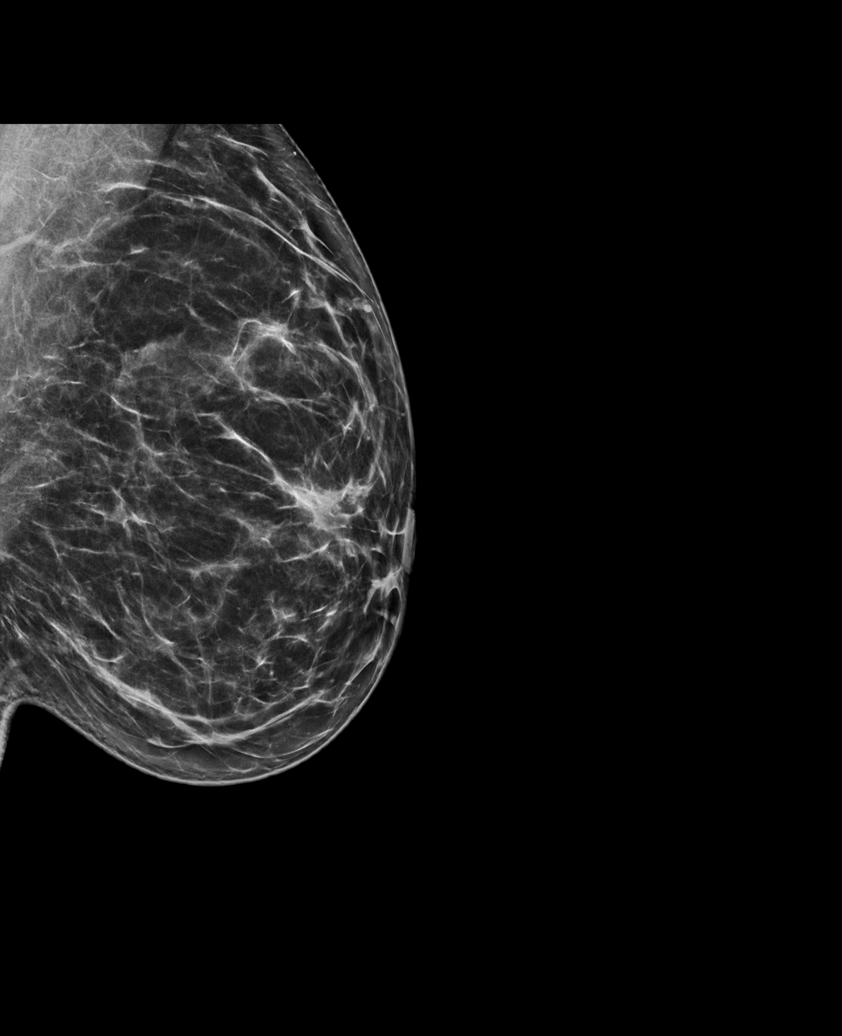

[L CC synth-2D]
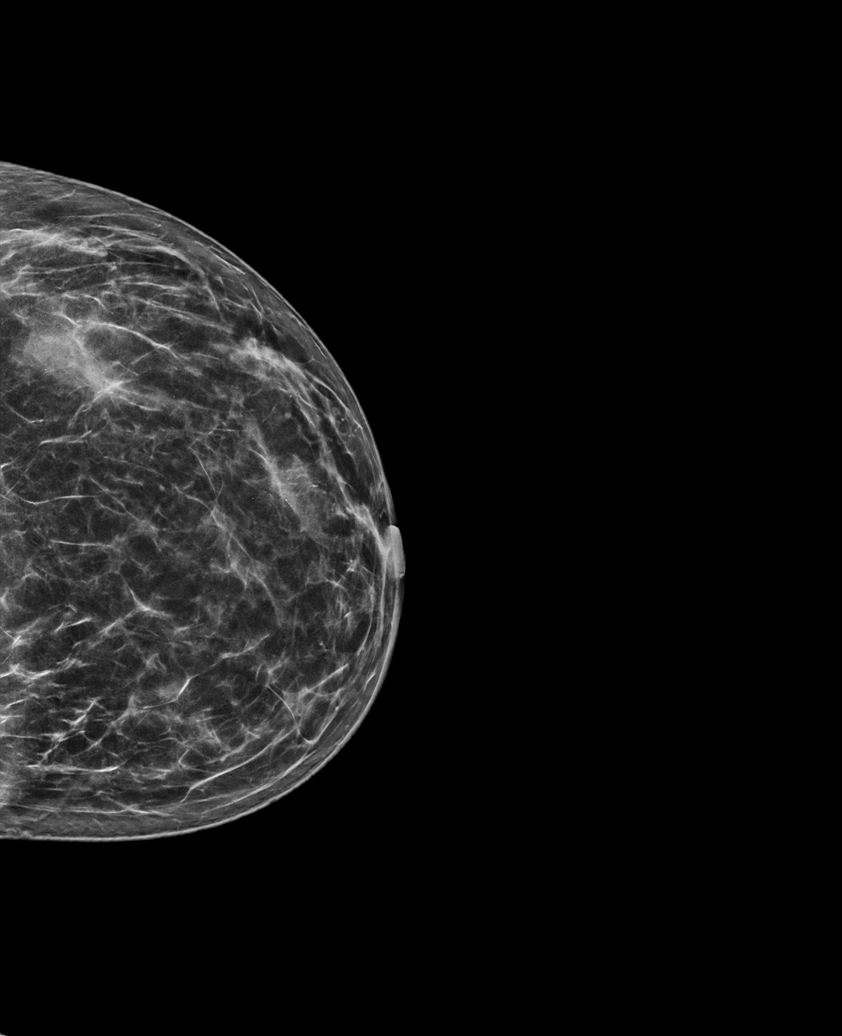

[L MLO tomo · tomo slice 39/76.0]
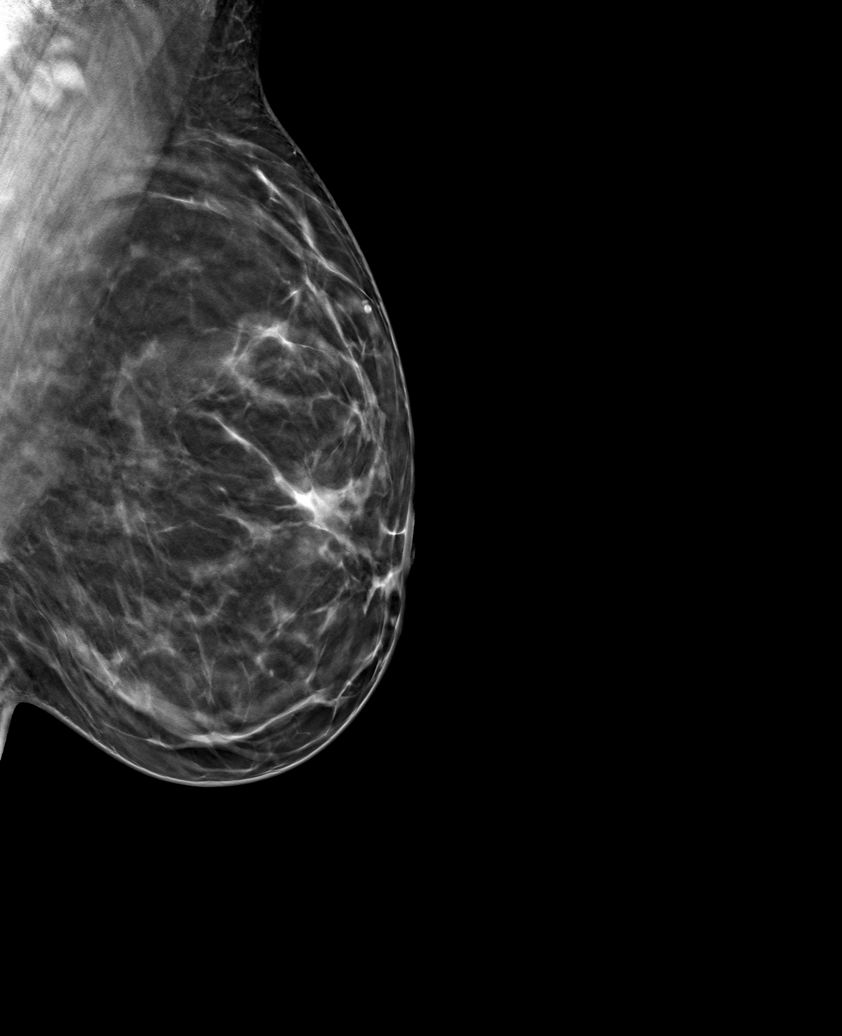

[L CC tomo · tomo slice 35/68.0]
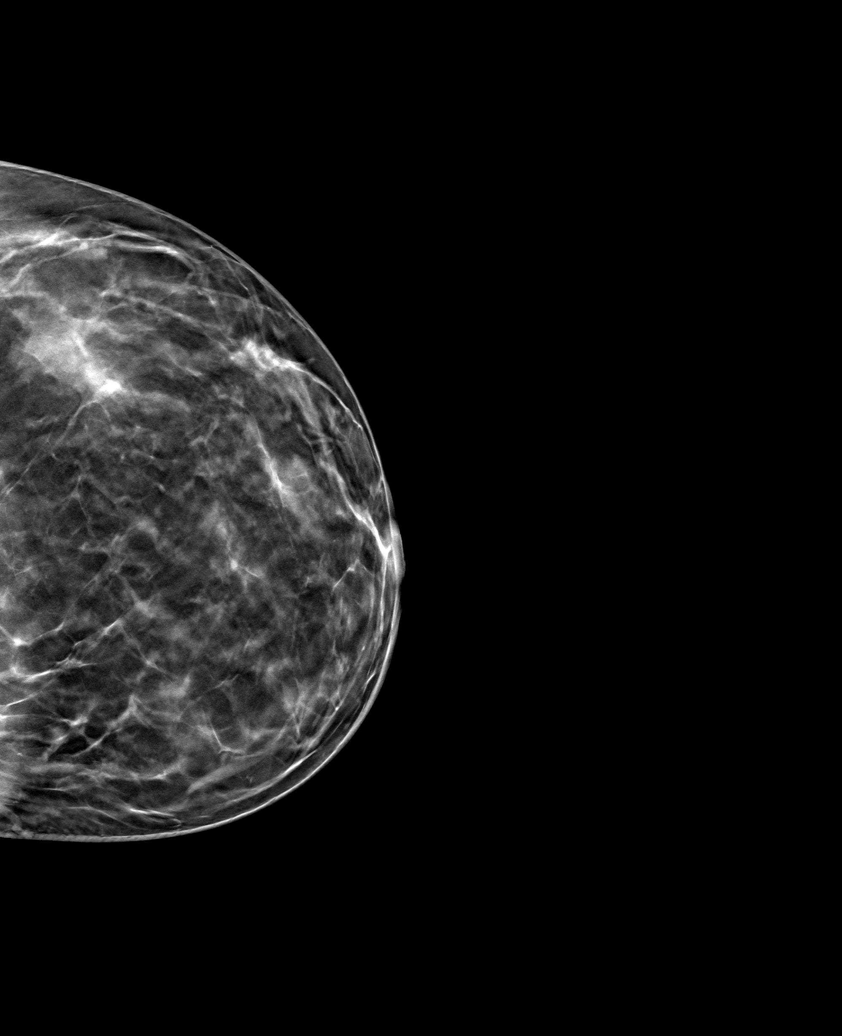

[6 of 14 positions shown; findings below may reference images not displayed]

ACR Breast Density Category c: The breast tissue is heterogeneously
dense, which may obscure small masses.
FINDINGS: The loosely grouped calcifications in the upper-outer quadrant of
the left breast are stable. No suspicious calcifications, masses or
areas of distortion are seen in the left breast.
IMPRESSION: Stable likely benign left breast calcifications.

RECOMMENDATION:
Six-month follow-up bilateral diagnostic mammogram.

I have discussed the findings and recommendations with the patient.
If applicable, a reminder letter will be sent to the patient
regarding the next appointment.

BI-RADS CATEGORY  3: Probably benign.

## 2022-08-06 DIAGNOSIS — Z809 Family history of malignant neoplasm, unspecified: Secondary | ICD-10-CM | POA: Diagnosis not present

## 2022-08-06 DIAGNOSIS — Z9189 Other specified personal risk factors, not elsewhere classified: Secondary | ICD-10-CM | POA: Diagnosis not present

## 2022-08-10 ENCOUNTER — Other Ambulatory Visit: Payer: Self-pay | Admitting: Obstetrics and Gynecology

## 2022-08-10 DIAGNOSIS — Z9189 Other specified personal risk factors, not elsewhere classified: Secondary | ICD-10-CM

## 2022-08-30 ENCOUNTER — Ambulatory Visit
Admission: RE | Admit: 2022-08-30 | Discharge: 2022-08-30 | Disposition: A | Discharge status: Home / Self Care | Payer: Self-pay | Source: Ambulatory Visit | Attending: Obstetrics and Gynecology | Admitting: Obstetrics and Gynecology

## 2022-08-30 DIAGNOSIS — Z9189 Other specified personal risk factors, not elsewhere classified: Secondary | ICD-10-CM

## 2022-08-30 MED ORDER — GADOPICLENOL 0.5 MMOL/ML IV SOLN
7.0000 mL | Freq: Once | INTRAVENOUS | Status: AC | PRN
  Administered 2022-08-30: 7 mL via INTRAVENOUS

## 2022-09-10 ENCOUNTER — Emergency Department (HOSPITAL_BASED_OUTPATIENT_CLINIC_OR_DEPARTMENT_OTHER)
Admission: EM | Admit: 2022-09-10 | Discharge: 2022-09-10 | Disposition: A | Discharge status: Home / Self Care | Payer: BC Managed Care – PPO | Attending: Emergency Medicine | Admitting: Emergency Medicine

## 2022-09-10 ENCOUNTER — Encounter (HOSPITAL_BASED_OUTPATIENT_CLINIC_OR_DEPARTMENT_OTHER): Payer: Self-pay | Admitting: Emergency Medicine

## 2022-09-10 ENCOUNTER — Other Ambulatory Visit (HOSPITAL_BASED_OUTPATIENT_CLINIC_OR_DEPARTMENT_OTHER): Payer: Self-pay

## 2022-09-10 ENCOUNTER — Other Ambulatory Visit: Payer: Self-pay

## 2022-09-10 DIAGNOSIS — T7840XA Allergy, unspecified, initial encounter: Secondary | ICD-10-CM | POA: Diagnosis not present

## 2022-09-10 DIAGNOSIS — T63441A Toxic effect of venom of bees, accidental (unintentional), initial encounter: Secondary | ICD-10-CM | POA: Insufficient documentation

## 2022-09-10 DIAGNOSIS — R1310 Dysphagia, unspecified: Secondary | ICD-10-CM | POA: Diagnosis not present

## 2022-09-10 MED ORDER — PREDNISONE 10 MG PO TABS
40.0000 mg | ORAL_TABLET | Freq: Every day | ORAL | 0 refills | Status: AC
  Filled 2022-09-10: qty 16, 4d supply, fill #0

## 2022-09-10 MED ORDER — PREDNISONE 50 MG PO TABS
60.0000 mg | ORAL_TABLET | Freq: Once | ORAL | Status: AC
  Administered 2022-09-10: 60 mg via ORAL
  Filled 2022-09-10: qty 1

## 2022-09-10 MED ORDER — EPINEPHRINE 0.3 MG/0.3ML IJ SOAJ
0.3000 mg | INTRAMUSCULAR | 0 refills | Status: DC | PRN
  Filled 2022-09-10: qty 2, 30d supply, fill #0

## 2022-09-10 NOTE — ED Provider Notes (Signed)
[name]   CSN: [id number] Arrival date & time: 09/10/22  0746     History  Chief Complaint  Patient presents with   Dysphagia    Erin [name] is a 42 y.o. female.  42 yo F with a chief complaints of difficulty swallowing.  This been going on this morning.  She was stung by bee about 48 hours ago and had what sounds like anaphylaxis had nausea vomiting and a diffuse rash.  She had called EMS who thought that she was fine and told her she could take Benadryl and Zantac at home.  She been taking that with some improvement but woke up this morning with some worsening and came here for evaluation.  She feels like the swelling to her chin is a bit worse than it was before as well.  Denies vomiting or diarrhea.  Denies difficulty breathing currently.        Home Medications Prior to Admission medications   Medication Sig Start Date End Date Taking? Authorizing Provider  EPINEPHrine 0.3 mg/0.3 mL IJ SOAJ injection Inject 0.3 mg into the muscle as needed for anaphylaxis. 09/10/22  Yes Deno Etienne, DO  predniSONE (DELTASONE) 10 MG tablet Take 4 tablets (40 mg total) by mouth daily for 4 days. 09/10/22 09/14/22 Yes Deno Etienne, DO  acetaminophen (TYLENOL) 500 MG tablet Take 1,000 mg by mouth as needed.    [provider]  famotidine (PEPCID) 20 MG tablet Take 1 tablet (20 mg total) by mouth 2 (two) times daily. 03/30/22   Montine Circle, PA-C  ibuprofen (ADVIL) 200 MG tablet Take by mouth as needed.    [provider]  Multiple Vitamin (MULTI-VITAMIN) tablet Take 1 tablet by mouth daily.    [provider]  naproxen (NAPROSYN) 500 MG tablet Take 1,000 mg by mouth as needed.    [provider]  SUMAtriptan (IMITREX) 25 MG tablet TAKE 1 TABLET BY MOUTH AS NEEDED FOR MIGRAINE. MAY REPEAT IN 2 HOURS IF HEADACHE PERSISTS OR RECURS. 04/27/22   Inda Coke, PA      Allergies    Patient has no known  allergies.    Review of Systems   Review of Systems  Physical Exam Updated Vital Signs BP 116/75   Pulse 78   Temp 98.1 F (36.7 C) (Oral)   Resp 13   Wt 77.1 kg   LMP 08/27/2022   SpO2 100%   BMI 25.85 kg/m  Physical Exam Vitals and nursing note reviewed.  Constitutional:      General: She is not in acute distress.    Appearance: She is well-developed. She is not diaphoretic.  HENT:     Head: Normocephalic and atraumatic.     Comments: Some erythema to the chin with some honey colored crust at the left side of the mentum.  No obvious posterior oropharyngeal erythema or edema. Eyes:     Pupils: Pupils are equal, round, and reactive to light.  Cardiovascular:     Rate and Rhythm: Normal rate and regular rhythm.     Heart sounds: No murmur heard.    No friction rub. No gallop.  Pulmonary:     Effort: Pulmonary effort is normal.     Breath sounds: No wheezing or rales.  Abdominal:     General: There is no distension.     Palpations: Abdomen is soft.     Tenderness: There is no abdominal tenderness.  Musculoskeletal:  General: No tenderness.     Cervical back: Normal range of motion and neck supple.  Skin:    General: Skin is warm and dry.  Neurological:     Mental Status: She is alert and oriented to person, place, and time.  Psychiatric:        Behavior: Behavior normal.     ED Results / Procedures / Treatments   Labs (all labs ordered are listed, but only abnormal results are displayed) Labs Reviewed - No data to display  EKG None  Radiology No results found.  Procedures Procedures    Medications Ordered in ED Medications  predniSONE (DELTASONE) tablet 60 mg (has no administration in time range)    ED Course/ Medical Decision Making/ A&P                             Medical Decision Making Risk Prescription drug management.   42 yo F with a chief complaints of difficulty swallowing.  Patient is tolerating her secretions here without  issue.  She is worried mostly about an allergy to bee stings.  No history of it in the past but was prescribed an EpiPen in the past.  She has no other signs of anaphylaxis.  Has localized reaction to the chin.  She has no wheezing no vomiting blood pressure is normal here.  Exam and history is also not consistent with infection.  Will start on burst of steroids.  Have her continue antihistamines at home.  Represcribe EpiPen as she said hers have expired.  PCP follow-up.  8:10 AM:  I have discussed the diagnosis/risks/treatment options with the patient.  Evaluation and diagnostic testing in the emergency department does not suggest an emergent condition requiring admission or immediate intervention beyond what has been performed at this time.  They will follow up with PCP. We also discussed returning to the ED immediately if new or worsening sx occur. We discussed the sx which are most concerning (e.g., sudden worsening pain, fever, inability to tolerate by mouth, anaphylaxis s/sx) that necessitate immediate return. Medications administered to the patient during their visit and any new prescriptions provided to the patient are listed below.  Medications given during this visit Medications  predniSONE (DELTASONE) tablet 60 mg (has no administration in time range)     The patient appears reasonably screen and/or stabilized for discharge and I doubt any other medical condition or other [facility] requiring further screening, evaluation, or treatment in the ED at this time prior to discharge.          Final Clinical Impression(s) / ED Diagnoses Final diagnoses:  Allergic reaction to bee sting    Rx / DC Orders ED Discharge Orders          Ordered    predniSONE (DELTASONE) 10 MG tablet  Daily        09/10/22 0804    EPINEPHrine 0.3 mg/0.3 mL IJ SOAJ injection  As needed        09/10/22 0804              Deno Etienne, DO 09/10/22 [phone]

## 2022-09-10 NOTE — ED Triage Notes (Signed)
Pt arrives pov, steady gait with c/o difficulty swallowing. Pt speaking in complete sentences. Reports bee sting to chin x 2 days pta, tx by EMS. Benadryl 50mg  and zantac at 0615 today

## 2022-09-10 NOTE — Discharge Instructions (Signed)
You can take 2 Zyrtec tablets a day for at least the next week.  You can take Zantac a couple times a day.  Please return for worsening difficulty swallowing, breathing, vomiting, diarrhea or if you feel like you might pass out.  If you feel like you need to use the epinephrine pen then use it and head towards the emergency department.

## 2022-09-10 NOTE — ED Notes (Signed)
EDP at [facility] 

## 2022-09-10 NOTE — ED Notes (Signed)
Pt discharged to home. Discharge instructions have been discussed with patient and/or family members. Pt verbally acknowledges understanding d/c instructions, and endorses comprehension to checkout at registration before leaving.  °

## 2022-11-05 ENCOUNTER — Ambulatory Visit
Admission: RE | Admit: 2022-11-05 | Discharge: 2022-11-05 | Disposition: A | Discharge status: Home / Self Care | Payer: Self-pay | Source: Ambulatory Visit | Attending: Obstetrics and Gynecology | Admitting: Obstetrics and Gynecology

## 2022-11-05 DIAGNOSIS — Z803 Family history of malignant neoplasm of breast: Secondary | ICD-10-CM | POA: Diagnosis not present

## 2022-11-05 DIAGNOSIS — R921 Mammographic calcification found on diagnostic imaging of breast: Secondary | ICD-10-CM | POA: Diagnosis not present

## 2022-12-31 DIAGNOSIS — T63441A Toxic effect of venom of bees, accidental (unintentional), initial encounter: Secondary | ICD-10-CM | POA: Diagnosis not present

## 2023-02-16 DIAGNOSIS — D225 Melanocytic nevi of trunk: Secondary | ICD-10-CM | POA: Diagnosis not present

## 2023-02-16 DIAGNOSIS — L858 Other specified epidermal thickening: Secondary | ICD-10-CM | POA: Diagnosis not present

## 2023-02-16 DIAGNOSIS — D2262 Melanocytic nevi of left upper limb, including shoulder: Secondary | ICD-10-CM | POA: Diagnosis not present

## 2023-02-16 DIAGNOSIS — D2261 Melanocytic nevi of right upper limb, including shoulder: Secondary | ICD-10-CM | POA: Diagnosis not present

## 2023-03-23 NOTE — Progress Notes (Signed)
Erin [name] is a 42 y.o. female here for a new problem.  History of Present Illness:   No chief complaint on file.   HPI  Neck Pain Reports having pain in her anterior neck in the *** vein area   Past Medical History:  Diagnosis Date   Depression    PPD NO MEDS   History of concussion 04/19/2017   HPV in female    Vaginal Pap smear, abnormal      Social History   Tobacco Use   Smoking status: Never   Smokeless tobacco: Never  Vaping Use   Vaping status: Never Used  Substance Use Topics   Alcohol use: Yes    Alcohol/week: 1.0 standard drink of alcohol    Types: 1 Glasses of wine per week   Drug use: Never    Past Surgical History:  Procedure Laterality Date   NO PAST SURGERIES      Family History  Problem Relation Age of Onset   High Cholesterol Mother    Hypertension Mother    Hyperlipidemia Mother    Obesity Father    Anxiety disorder Sister    Depression Sister    Breast cancer Sister 55   Breast cancer Maternal Aunt        60's   Macular degeneration Maternal Grandfather    Breast cancer Paternal Grandmother 26   Stroke Paternal Grandfather    Heart murmur Brother     No Known Allergies  Current Medications:   Current Outpatient Medications:    acetaminophen (TYLENOL) 500 MG tablet, Take 1,000 mg by mouth as needed., Disp: , Rfl:    EPINEPHrine 0.3 mg/0.3 mL IJ SOAJ injection, Inject 0.3 mg into the muscle as needed for anaphylaxis., Disp: 2 each, Rfl: 0   famotidine (PEPCID) 20 MG tablet, Take 1 tablet (20 mg total) by mouth 2 (two) times daily., Disp: 30 tablet, Rfl: 0   ibuprofen (ADVIL) 200 MG tablet, Take by mouth as needed., Disp: , Rfl:    Multiple Vitamin (MULTI-VITAMIN) tablet, Take 1 tablet by mouth daily., Disp: , Rfl:    naproxen (NAPROSYN) 500 MG tablet, Take 1,000 mg by mouth as needed., Disp: , Rfl:    SUMAtriptan (IMITREX) 25 MG tablet, TAKE 1 TABLET BY MOUTH AS NEEDED FOR MIGRAINE. MAY REPEAT IN 2 HOURS IF HEADACHE PERSISTS OR  RECURS., Disp: 9 tablet, Rfl: 0   Review of Systems:   ROS  Vitals:   There were no vitals filed for this visit.   There is no height or weight on file to calculate BMI.  Physical Exam:   Physical Exam  Assessment and Plan:   ***   I,Alexander Ruley,acting as a scribe for Jarold Motto, PA.,have documented all relevant documentation on the behalf of Jarold Motto, PA,as directed by  Jarold Motto, PA while in the presence of Jarold Motto, [place].   ***  Jarold Motto, PA-C

## 2023-03-24 ENCOUNTER — Encounter: Payer: Self-pay | Admitting: Physician Assistant

## 2023-03-24 ENCOUNTER — Ambulatory Visit: Payer: BC Managed Care – PPO | Admitting: Physician Assistant

## 2023-03-24 VITALS — BP 100/70 | HR 60 | Temp 97.3°F | Ht 68.0 in | Wt 169.2 lb

## 2023-03-24 DIAGNOSIS — H1011 Acute atopic conjunctivitis, right eye: Secondary | ICD-10-CM | POA: Diagnosis not present

## 2023-03-24 DIAGNOSIS — Z23 Encounter for immunization: Secondary | ICD-10-CM

## 2023-03-24 DIAGNOSIS — E782 Mixed hyperlipidemia: Secondary | ICD-10-CM

## 2023-03-24 DIAGNOSIS — M542 Cervicalgia: Secondary | ICD-10-CM | POA: Diagnosis not present

## 2023-03-24 LAB — LIPID PANEL
Cholesterol: 161 mg/dL (ref 0–200)
HDL: 42.7 mg/dL (ref >39.00)
LDL Cholesterol: 99 mg/dL (ref 0–99)
NonHDL: 118.69
Total CHOL/HDL Ratio: 4
Triglycerides: 96 mg/dL (ref 0.0–149.0)
VLDL: 19.2 mg/dL (ref 0.0–40.0)

## 2023-03-24 MED ORDER — POLYMYXIN B-TRIMETHOPRIM 10000-0.1 UNIT/ML-% OP SOLN: 1.0000 [drp] | OPHTHALMIC | 0 refills | Status: DC

## 2023-03-24 NOTE — Patient Instructions (Signed)
It was great to see you!  We will order carotid ultrasound and lipid panel today  If you would like the calcium score - please reach out!  Eye drops sent in for your medicine cabinet.  Take care,  Jarold Motto PA-C

## 2023-04-14 ENCOUNTER — Ambulatory Visit (HOSPITAL_COMMUNITY)
Admission: RE | Admit: 2023-04-14 | Discharge: 2023-04-14 | Disposition: A | Discharge status: Home / Self Care | Payer: BC Managed Care – PPO | Source: Ambulatory Visit | Attending: Cardiovascular Disease | Admitting: Cardiovascular Disease

## 2023-04-14 DIAGNOSIS — M542 Cervicalgia: Secondary | ICD-10-CM | POA: Diagnosis not present

## 2023-04-22 ENCOUNTER — Encounter: Payer: Self-pay | Admitting: Physician Assistant

## 2023-04-22 DIAGNOSIS — I773 Arterial fibromuscular dysplasia: Secondary | ICD-10-CM

## 2023-05-10 NOTE — Progress Notes (Unsigned)
[facility] [place]  ASSESSMENT / PLAN: 42 y.o. 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  Encourage patient to start aspirin 81 mg by mouth daily to reduce risk of stroke.  No role for intervention unless patient develops symptomatic disease while on antiplatelet therapy.  Will check for other related arteriopathies.  Check CT angiogram of the head and neck to evaluate for intracranial aneurysm.  Check duplex of the renal arteries to look for renal fibromuscular dysplasia.  I will call her to review the results with her.  CHIEF COMPLAINT: Carotid abnormality  HISTORY OF PRESENT ILLNESS: Erin [name] is a 42 y.o. female referred to clinic for evaluation of possible fibromuscular dysplasia identified on carotid ultrasound.  This was ordered for an episode of neck pain the patient developed while playing soccer with her son.  The neck pain was transient and self-limited.  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  The bulk of our visit was spent reviewing the natural history of fibromuscular dysplasia, and the rationale for other imaging test once this diagnosis was made.   Past Medical History:  Diagnosis Date   Depression    PPD NO MEDS   History of concussion 04/19/2017   HPV in female    Vaginal Pap smear, abnormal     Past Surgical History:  Procedure Laterality Date   NO PAST SURGERIES      Family History  Problem Relation Age of Onset   High Cholesterol Mother    Hypertension Mother    Obesity Father    Anxiety disorder Sister    Depression Sister    Breast cancer Sister 58   Heart murmur Brother    Macular degeneration Maternal Grandfather    Breast cancer Paternal Grandmother 75   Stroke Paternal Grandfather    Breast cancer Maternal Aunt        51's    Social History   Socioeconomic History   Marital status: Married    Spouse name:  Not on file   Number of children: Not on file   Years of education: Not on file   Highest education level: Bachelor's degree (e.g., BA, AB, BS)  Occupational History   Not on file  Tobacco Use   Smoking status: Never   Smokeless tobacco: Never  Vaping Use   Vaping status: Never Used  Substance and Sexual Activity   Alcohol use: Yes    Alcohol/week: 1.0 standard drink of alcohol    Types: 1 Glasses of wine per week   Drug use: Never   Sexual activity: Yes    Partners: Male    Birth control/protection: Surgical    Comment: husband has had vasectomy  Other Topics Concern   Not on file  Social History Narrative   Has 41 mo old infant and 67 yo son      Social Determinants of Health   Financial Resource Strain: Low Risk  (03/22/2023)   Overall Financial Resource Strain (CARDIA)    Difficulty of Paying Living Expenses: Not very hard  Food Insecurity: No Food Insecurity (03/22/2023)   Hunger Vital Sign    Worried About Running Out of Food in the Last Year: Never true    Ran Out of Food in the Last Year: Never true  Transportation Needs: No Transportation Needs (03/22/2023)   PRAPARE - Administrator, Civil Service (Medical): No    Lack of Transportation (Non-Medical):  No  Physical Activity: Insufficiently Active (03/22/2023)   Exercise Vital Sign    Days of Exercise per Week: 3 days    Minutes of Exercise per Session: 40 min  Stress: Stress Concern Present (03/22/2023)   [employer] of Occupational Health - Occupational Stress Questionnaire    Feeling of Stress : Rather much  Social Connections: Socially Integrated (03/22/2023)   Social Connection and Isolation Panel [NHANES]    Frequency of Communication with Friends and Family: Twice a week    Frequency of Social Gatherings with Friends and Family: Once a week    Attends Religious Services: More than 4 times per year    Active Member of [employer] or Organizations: Yes    Attends Engineer, structural:  More than 4 times per year    Marital Status: Married  Catering manager Violence: Not At Risk (11/23/2018)   Humiliation, Afraid, Rape, and Kick questionnaire    Fear of Current or Ex-Partner: No    Emotionally Abused: No    Physically Abused: No    Sexually Abused: No    No Known Allergies  Current Outpatient Medications  Medication Sig Dispense Refill   acetaminophen (TYLENOL) 500 MG tablet Take 1,000 mg by mouth as needed.     cetirizine (ZYRTEC) 10 MG tablet Take 10 mg by mouth as needed for allergies.     EPINEPHrine 0.3 mg/0.3 mL IJ SOAJ injection Inject 0.3 mg into the muscle as needed for anaphylaxis. 2 each 0   ibuprofen (ADVIL) 200 MG tablet Take by mouth as needed.     Multiple Vitamin (MULTI-VITAMIN) tablet Take 1 tablet by mouth daily.     naproxen (NAPROSYN) 500 MG tablet Take 1,000 mg by mouth as needed.     SUMAtriptan (IMITREX) 25 MG tablet TAKE 1 TABLET BY MOUTH AS NEEDED FOR MIGRAINE. MAY REPEAT IN 2 HOURS IF HEADACHE PERSISTS OR RECURS. 9 tablet 0   trimethoprim-polymyxin b (POLYTRIM) ophthalmic solution Place 1 drop into the right eye every 4 (four) hours. 10 mL 0   No current facility-administered medications for this visit.    PHYSICAL EXAM Vitals:   05/11/23 1355  BP: 110/74  Pulse: 82  Resp: 20  Temp: 97.9 F (36.6 C)  SpO2: 99%  Weight: 172 lb (78 kg)  Height: 5\' 8"  (1.727 m)    Healthy appearing middle-aged woman in no distress Regular rate and rhythm Unlabored breathing Normal neurologic exam  PERTINENT LABORATORY AND RADIOLOGIC DATA  Most recent CBC    Latest Ref Rng & Units 11/26/2020    4:28 PM 12/02/2018    7:06 AM 12/01/2018    6:47 AM  CBC  WBC 4.0 - 10.5 K/uL 11.8  12.6  13.2   Hemoglobin 12.0 - 15.0 g/dL 76.1  9.7  60.7   Hematocrit 36.0 - 46.0 % 37.0  31.2  35.6   Platelets 150.0 - 400.0 K/uL 347.0  269  340      Most recent CMP    Latest Ref Rng & Units 11/26/2020    4:28 PM 01/28/2018   10:06 AM  CMP  Glucose 70 - 99  mg/dL 95  73   BUN 6 - 23 mg/dL 14  14   Creatinine 3.71 - 1.20 mg/dL 0.62  6.94   Sodium 854 - 145 mEq/L 140  140   Potassium 3.5 - 5.1 mEq/L 3.8  4.2   Chloride 96 - 112 mEq/L 105  106   CO2 19 - 32 mEq/L 28  29   Calcium 8.4 - 10.5 mg/dL 9.3  9.5   Total Protein 6.0 - 8.3 g/dL 6.7  6.8   Total Bilirubin 0.2 - 1.2 mg/dL 0.3  0.6   Alkaline Phos 39 - 117 U/L 53  43   AST 0 - 37 U/L 13  11   ALT 0 - 35 U/L 8  8     Renal function CrCl cannot be calculated (Patient's most recent lab result is older than the maximum 21 days allowed.).  No results found for: "HGBA1C"  LDL Cholesterol  Date Value Ref Range Status  03/24/2023 99 0 - 99 mg/dL Final    Right Carotid: The extracranial vessels were near-normal with only minimal  wall                thickening or plaque.  Possible fibromuscular dysplasia is identified by technologist  Left Carotid: The extracranial vessels were near-normal with only minimal  wall               thickening or plaque.   Vertebrals:  Bilateral vertebral arteries demonstrate antegrade flow.  Subclavians: Normal flow hemodynamics were seen in bilateral subclavian               arteries.   Rande Brunt. Lenell Antu, MD [facility] Phone Number: [phone] 05/11/2023 4:19 PM   Total time spent on preparing this encounter including chart review, data review, collecting history, examining the patient, coordinating care for this new patient, 60 minutes.  Portions of this report may have been transcribed using voice recognition software.  Every effort has been made to ensure accuracy; however, inadvertent computerized transcription errors may still be present.

## 2023-05-11 ENCOUNTER — Ambulatory Visit (INDEPENDENT_AMBULATORY_CARE_PROVIDER_SITE_OTHER): Payer: BC Managed Care – PPO | Admitting: Vascular Surgery

## 2023-05-11 ENCOUNTER — Encounter: Payer: Self-pay | Admitting: Vascular Surgery

## 2023-05-11 VITALS — BP 110/74 | HR 82 | Temp 97.9°F | Resp 20 | Ht 68.0 in | Wt 172.0 lb

## 2023-05-11 DIAGNOSIS — I773 Arterial fibromuscular dysplasia: Secondary | ICD-10-CM

## 2023-05-13 ENCOUNTER — Other Ambulatory Visit: Payer: Self-pay

## 2023-05-13 DIAGNOSIS — I773 Arterial fibromuscular dysplasia: Secondary | ICD-10-CM

## 2023-06-01 DIAGNOSIS — N7689 Other specified inflammation of vagina and vulva: Secondary | ICD-10-CM | POA: Diagnosis not present

## 2023-06-16 ENCOUNTER — Other Ambulatory Visit: Payer: Self-pay

## 2023-06-16 DIAGNOSIS — I773 Arterial fibromuscular dysplasia: Secondary | ICD-10-CM

## 2023-06-25 ENCOUNTER — Ambulatory Visit
Admission: RE | Admit: 2023-06-25 | Discharge: 2023-06-25 | Disposition: A | Discharge status: Home / Self Care | Payer: BC Managed Care – PPO | Source: Ambulatory Visit | Attending: Vascular Surgery

## 2023-06-25 ENCOUNTER — Ambulatory Visit
Admission: RE | Admit: 2023-06-25 | Discharge: 2023-06-25 | Disposition: A | Discharge status: Home / Self Care | Payer: BC Managed Care – PPO | Source: Ambulatory Visit | Attending: Vascular Surgery | Admitting: Vascular Surgery

## 2023-06-25 DIAGNOSIS — I773 Arterial fibromuscular dysplasia: Secondary | ICD-10-CM

## 2023-06-25 DIAGNOSIS — I771 Stricture of artery: Secondary | ICD-10-CM | POA: Diagnosis not present

## 2023-06-25 MED ORDER — IOPAMIDOL (ISOVUE-370) INJECTION 76%
75.0000 mL | Freq: Once | INTRAVENOUS | Status: AC | PRN
  Administered 2023-06-25: 75 mL via INTRAVENOUS

## 2023-07-08 DIAGNOSIS — Z1151 Encounter for screening for human papillomavirus (HPV): Secondary | ICD-10-CM | POA: Diagnosis not present

## 2023-07-08 DIAGNOSIS — Z01419 Encounter for gynecological examination (general) (routine) without abnormal findings: Secondary | ICD-10-CM | POA: Diagnosis not present

## 2023-07-08 DIAGNOSIS — N926 Irregular menstruation, unspecified: Secondary | ICD-10-CM | POA: Diagnosis not present

## 2023-07-08 DIAGNOSIS — Z6826 Body mass index (BMI) 26.0-26.9, adult: Secondary | ICD-10-CM | POA: Diagnosis not present

## 2023-07-08 DIAGNOSIS — Z124 Encounter for screening for malignant neoplasm of cervix: Secondary | ICD-10-CM | POA: Diagnosis not present

## 2023-07-13 ENCOUNTER — Ambulatory Visit: Payer: BC Managed Care – PPO | Admitting: Vascular Surgery

## 2023-07-16 DIAGNOSIS — S61200A Unspecified open wound of right index finger without damage to nail, initial encounter: Secondary | ICD-10-CM | POA: Diagnosis not present

## 2023-07-19 NOTE — Progress Notes (Signed)
[facility] OF [place]  ASSESSMENT / PLAN: 43 y.o. 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 She has no evidence of intracranial anerusyms of FMD of renal arteries. 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 Recommend aspirin 81 mg by mouth daily to reduce risk of stroke.  No role for intervention unless patient develops symptomatic disease while on antiplatelet therapy. Follow up with me as needed.  CHIEF COMPLAINT: Carotid abnormality  HISTORY OF PRESENT ILLNESS: Erin [name] is a 43 y.o. female referred to clinic for evaluation of possible fibromuscular dysplasia identified on carotid ultrasound.  This was ordered for an episode of neck pain the patient developed while playing soccer with her son.  The neck pain was transient and self-limited.  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  The bulk of our visit was spent reviewing the natural history of fibromuscular dysplasia, and the rationale for other imaging test once this diagnosis was made.  07/20/23: Patient returns to clinic to discuss workup for FMD to date.  She underwent a CT angiogram which was fairly normal.  This did show some fenestrations of the basilar and vertebral arteries, I counseled her about these and encouraged her to monitor these given her asymptomatic status.  The CT angiogram did not show any evidence of fibromuscular dysplasia in the cervical carotid.  The renal artery duplex did not show any evidence of fibromuscular dysplasia.  There is no evidence of intracranial aneurysm.   Past Medical History:  Diagnosis Date   Depression    PPD NO MEDS   History of concussion 04/19/2017   HPV in female    Vaginal Pap smear, abnormal     Past Surgical History:  Procedure Laterality Date   NO PAST SURGERIES      Family History   Problem Relation Age of Onset   High Cholesterol Mother    Hypertension Mother    Obesity Father    Anxiety disorder Sister    Depression Sister    Breast cancer Sister 79   Heart murmur Brother    Macular degeneration Maternal Grandfather    Breast cancer Paternal Grandmother 57   Stroke Paternal Grandfather    Breast cancer Maternal Aunt        29's    Social History   Socioeconomic History   Marital status: Married    Spouse name: Not on file   Number of children: Not on file   Years of education: Not on file   Highest education level: Bachelor's degree (e.g., BA, AB, BS)  Occupational History   Not on file  Tobacco Use   Smoking status: Never   Smokeless tobacco: Never  Vaping Use   Vaping status: Never Used  Substance and Sexual Activity   Alcohol use: Yes    Alcohol/week: 1.0 standard drink of alcohol    Types: 1 Glasses of wine per week   Drug use: Never   Sexual activity: Yes    Partners: Male    Birth control/protection: Surgical    Comment: husband has had vasectomy  Other Topics Concern   Not on file  Social History Narrative   Has 56 mo old infant and 56 yo son      Social Drivers of Corporate investment banker Strain: Low Risk  (03/22/2023)   Overall Financial Resource Strain (CARDIA)    Difficulty of Paying  Living Expenses: Not very hard  Food Insecurity: No Food Insecurity (03/22/2023)   Hunger Vital Sign    Worried About Running Out of Food in the Last Year: Never true    Ran Out of Food in the Last Year: Never true  Transportation Needs: No Transportation Needs (03/22/2023)   PRAPARE - Administrator, Civil Service (Medical): No    Lack of Transportation (Non-Medical): No  Physical Activity: Insufficiently Active (03/22/2023)   Exercise Vital Sign    Days of Exercise per Week: 3 days    Minutes of Exercise per Session: 40 min  Stress: Stress Concern Present (03/22/2023)   [employer] of Occupational Health - Occupational  Stress Questionnaire    Feeling of Stress : Rather much  Social Connections: Socially Integrated (03/22/2023)   Social Connection and Isolation Panel [NHANES]    Frequency of Communication with Friends and Family: Twice a week    Frequency of Social Gatherings with Friends and Family: Once a week    Attends Religious Services: More than 4 times per year    Active Member of [employer] or Organizations: Yes    Attends Engineer, structural: More than 4 times per year    Marital Status: Married  Catering manager Violence: Not At Risk (11/23/2018)   Humiliation, Afraid, Rape, and Kick questionnaire    Fear of Current or Ex-Partner: No    Emotionally Abused: No    Physically Abused: No    Sexually Abused: No    No Known Allergies  Current Outpatient Medications  Medication Sig Dispense Refill   acetaminophen (TYLENOL) 500 MG tablet Take 1,000 mg by mouth as needed.     cetirizine (ZYRTEC) 10 MG tablet Take 10 mg by mouth as needed for allergies.     EPINEPHrine 0.3 mg/0.3 mL IJ SOAJ injection Inject 0.3 mg into the muscle as needed for anaphylaxis. 2 each 0   ibuprofen (ADVIL) 200 MG tablet Take by mouth as needed.     Multiple Vitamin (MULTI-VITAMIN) tablet Take 1 tablet by mouth daily.     naproxen (NAPROSYN) 500 MG tablet Take 1,000 mg by mouth as needed.     SUMAtriptan (IMITREX) 25 MG tablet TAKE 1 TABLET BY MOUTH AS NEEDED FOR MIGRAINE. MAY REPEAT IN 2 HOURS IF HEADACHE PERSISTS OR RECURS. 9 tablet 0   trimethoprim-polymyxin b (POLYTRIM) ophthalmic solution Place 1 drop into the right eye every 4 (four) hours. 10 mL 0   No current facility-administered medications for this visit.    PHYSICAL EXAM There were no vitals filed for this visit.   Healthy appearing middle-aged woman in no distress Regular rate and rhythm Unlabored breathing Normal neurologic exam  PERTINENT LABORATORY AND RADIOLOGIC DATA  Most recent CBC    Latest Ref Rng & Units 11/26/2020    4:28 PM  12/02/2018    7:06 AM 12/01/2018    6:47 AM  CBC  WBC 4.0 - 10.5 K/uL 11.8  12.6  13.2   Hemoglobin 12.0 - 15.0 g/dL 16.1  9.7  09.6   Hematocrit 36.0 - 46.0 % 37.0  31.2  35.6   Platelets 150.0 - 400.0 K/uL 347.0  269  340      Most recent CMP    Latest Ref Rng & Units 11/26/2020    4:28 PM 01/28/2018   10:06 AM  CMP  Glucose 70 - 99 mg/dL 95  73   BUN 6 - 23 mg/dL 14  14   Creatinine 0.45 -  1.20 mg/dL 1.61  0.96   Sodium 045 - 145 mEq/L 140  140   Potassium 3.5 - 5.1 mEq/L 3.8  4.2   Chloride 96 - 112 mEq/L 105  106   CO2 19 - 32 mEq/L 28  29   Calcium 8.4 - 10.5 mg/dL 9.3  9.5   Total Protein 6.0 - 8.3 g/dL 6.7  6.8   Total Bilirubin 0.2 - 1.2 mg/dL 0.3  0.6   Alkaline Phos 39 - 117 U/L 53  43   AST 0 - 37 U/L 13  11   ALT 0 - 35 U/L 8  8     Renal function CrCl cannot be calculated (Patient's most recent lab result is older than the maximum 21 days allowed.).  No results found for: "HGBA1C"  LDL Cholesterol  Date Value Ref Range Status  03/24/2023 99 0 - 99 mg/dL Final    Right Carotid: The extracranial vessels were near-normal with only minimal  wall                thickening or plaque.  Possible fibromuscular dysplasia is identified by technologist  Left Carotid: The extracranial vessels were near-normal with only minimal  wall               thickening or plaque.   Vertebrals:  Bilateral vertebral arteries demonstrate antegrade flow.  Subclavians: Normal flow hemodynamics were seen in bilateral subclavian               arteries.   CTA head and neck  1. No acute intracranial process. 2. No intracranial large vessel occlusion or significant stenosis. 3. No hemodynamically significant stenosis in the neck. No beading is seen to suggest fibromuscular dysplasia.   Renal duplex. Personally reviewed. No evidence of FMD  Rande Brunt. Lenell Antu, MD FACS [facility] Phone Number: [phone] 07/19/2023 5:00 PM   Total  time spent on preparing this encounter including chart review, data review, collecting history, examining the patient, coordinating care for this new patient, 60 minutes.  Portions of this report may have been transcribed using voice recognition software.  Every effort has been made to ensure accuracy; however, inadvertent computerized transcription errors may still be present.

## 2023-07-20 ENCOUNTER — Ambulatory Visit (INDEPENDENT_AMBULATORY_CARE_PROVIDER_SITE_OTHER): Payer: BC Managed Care – PPO | Admitting: Vascular Surgery

## 2023-07-20 ENCOUNTER — Encounter: Payer: Self-pay | Admitting: Vascular Surgery

## 2023-07-20 ENCOUNTER — Ambulatory Visit (HOSPITAL_COMMUNITY)
Admission: RE | Admit: 2023-07-20 | Discharge: 2023-07-20 | Disposition: A | Discharge status: Home / Self Care | Payer: BC Managed Care – PPO | Source: Ambulatory Visit | Attending: Vascular Surgery | Admitting: Vascular Surgery

## 2023-07-20 VITALS — BP 107/73 | HR 80 | Temp 97.9°F | Resp 20 | Ht 68.0 in | Wt 174.0 lb

## 2023-07-20 DIAGNOSIS — I773 Arterial fibromuscular dysplasia: Secondary | ICD-10-CM

## 2023-09-02 ENCOUNTER — Other Ambulatory Visit: Payer: Self-pay | Admitting: Obstetrics and Gynecology

## 2023-09-02 DIAGNOSIS — Z1231 Encounter for screening mammogram for malignant neoplasm of breast: Secondary | ICD-10-CM

## 2023-09-03 ENCOUNTER — Encounter: Payer: Self-pay | Admitting: Physician Assistant

## 2023-09-22 ENCOUNTER — Encounter: Admitting: Physician Assistant

## 2023-10-14 ENCOUNTER — Encounter (HOSPITAL_COMMUNITY): Payer: Self-pay

## 2023-10-21 ENCOUNTER — Ambulatory Visit (INDEPENDENT_AMBULATORY_CARE_PROVIDER_SITE_OTHER): Admitting: Physician Assistant

## 2023-10-21 ENCOUNTER — Telehealth: Payer: Self-pay

## 2023-10-21 ENCOUNTER — Other Ambulatory Visit (HOSPITAL_COMMUNITY): Payer: Self-pay

## 2023-10-21 ENCOUNTER — Encounter: Payer: Self-pay | Admitting: Physician Assistant

## 2023-10-21 VITALS — BP 110/60 | HR 81 | Temp 97.5°F | Ht 68.0 in | Wt 169.0 lb

## 2023-10-21 DIAGNOSIS — Z0000 Encounter for general adult medical examination without abnormal findings: Secondary | ICD-10-CM

## 2023-10-21 DIAGNOSIS — Z91030 Bee allergy status: Secondary | ICD-10-CM

## 2023-10-21 DIAGNOSIS — I773 Arterial fibromuscular dysplasia: Secondary | ICD-10-CM | POA: Insufficient documentation

## 2023-10-21 DIAGNOSIS — G43829 Menstrual migraine, not intractable, without status migrainosus: Secondary | ICD-10-CM | POA: Diagnosis not present

## 2023-10-21 DIAGNOSIS — Z136 Encounter for screening for cardiovascular disorders: Secondary | ICD-10-CM

## 2023-10-21 DIAGNOSIS — Z1159 Encounter for screening for other viral diseases: Secondary | ICD-10-CM

## 2023-10-21 DIAGNOSIS — Z13220 Encounter for screening for lipoid disorders: Secondary | ICD-10-CM

## 2023-10-21 DIAGNOSIS — Z1211 Encounter for screening for malignant neoplasm of colon: Secondary | ICD-10-CM

## 2023-10-21 LAB — CBC WITH DIFFERENTIAL/PLATELET
Basophils Absolute: 0.1 10*3/uL (ref 0.0–0.1)
Basophils Relative: 0.9 % (ref 0.0–3.0)
Eosinophils Absolute: 0.2 10*3/uL (ref 0.0–0.7)
Eosinophils Relative: 3 % (ref 0.0–5.0)
HCT: 36.9 % (ref 36.0–46.0)
Hemoglobin: 12.5 g/dL (ref 12.0–15.0)
Lymphocytes Relative: 27.8 % (ref 12.0–46.0)
Lymphs Abs: 1.5 10*3/uL (ref 0.7–4.0)
MCHC: 33.9 g/dL (ref 30.0–36.0)
MCV: 81.2 fl (ref 78.0–100.0)
Monocytes Absolute: 0.4 10*3/uL (ref 0.1–1.0)
Monocytes Relative: 6.9 % (ref 3.0–12.0)
Neutro Abs: 3.4 10*3/uL (ref 1.4–7.7)
Neutrophils Relative %: 61.4 % (ref 43.0–77.0)
Platelets: 293 10*3/uL (ref 150.0–400.0)
RBC: 4.55 Mil/uL (ref 3.87–5.11)
RDW: 13.7 % (ref 11.5–15.5)
WBC: 5.5 10*3/uL (ref 4.0–10.5)

## 2023-10-21 LAB — COMPREHENSIVE METABOLIC PANEL WITH GFR
ALT: 8 U/L (ref 0–35)
AST: 12 U/L (ref 0–37)
Albumin: 4.2 g/dL (ref 3.5–5.2)
Alkaline Phosphatase: 47 U/L (ref 39–117)
BUN: 12 mg/dL (ref 6–23)
CO2: 27 meq/L (ref 19–32)
Calcium: 8.8 mg/dL (ref 8.4–10.5)
Chloride: 105 meq/L (ref 96–112)
Creatinine, Ser: 0.89 mg/dL (ref 0.40–1.20)
GFR: 78.55 mL/min (ref >60.00)
Glucose, Bld: 77 mg/dL (ref 70–99)
Potassium: 3.8 meq/L (ref 3.5–5.1)
Sodium: 139 meq/L (ref 135–145)
Total Bilirubin: 0.6 mg/dL (ref 0.2–1.2)
Total Protein: 6.6 g/dL (ref 6.0–8.3)

## 2023-10-21 LAB — LIPID PANEL
Cholesterol: 165 mg/dL (ref 0–200)
HDL: 45 mg/dL (ref >39.00)
LDL Cholesterol: 101 mg/dL — ABNORMAL HIGH (ref 0–99)
NonHDL: 119.55
Total CHOL/HDL Ratio: 4
Triglycerides: 94 mg/dL (ref 0.0–149.0)
VLDL: 18.8 mg/dL (ref 0.0–40.0)

## 2023-10-21 MED ORDER — EPINEPHRINE 0.3 MG/0.3ML IJ SOAJ: 0.3000 mg | INTRAMUSCULAR | 1 refills | Status: AC | PRN

## 2023-10-21 MED ORDER — NURTEC 75 MG PO TBDP: 75.0000 mg | ORAL_TABLET | ORAL | 11 refills | Status: AC

## 2023-10-21 NOTE — Patient Instructions (Addendum)
 It was great to see you!  Start up the 81 mg aspirin Trial Nurtec sample  It will require prior authorization to this script to you so please be patient! Message me after you've tried it.  Please go to the lab for blood work.   Our office will call you with your results unless you have chosen to receive results via MyChart.  If your blood work is normal we will follow-up each year for physicals and as scheduled for chronic medical problems.  If anything is abnormal we will treat accordingly and get you in for a follow-up.  Take care,  Olga Seyler

## 2023-10-21 NOTE — Telephone Encounter (Signed)
 Pharmacy Patient Advocate Encounter   Received notification from Patient Pharmacy that prior authorization for Nurtec 75 is required/requested.   Insurance verification completed.   The patient is insured through [employer] .   Per test claim: PA required; PA submitted to above mentioned insurance via CoverMyMeds Key/confirmation #/EOC [id number] Status is pending

## 2023-10-21 NOTE — Progress Notes (Signed)
 Subjective:    Erin [name] is a 43 y.o. female and is here for a comprehensive physical exam.  Migraine  Pertinent negatives include no abdominal pain, back pain, coughing, dizziness, fever, hearing loss, nausea, neck pain, seizures, sore throat, tingling, vomiting or weight loss.    Health Maintenance Due  Topic Date Due   Hepatitis C Screening  Never done    Acute Concerns: Migraines She is having menstrual migraines She has discussed it with her obstetrics-gynecology and they recommended starting oral contraceptive pill(s), she is concerned about this given she is having auras and has had a vascular work-up showing that she may be at slight increased risk of stroke Imitrex  is not effective for her  Chronic Issues: Bee sting allergy Needs epi-pen refill   FMD of carotid artery Has had extensive work-up for this and see vascular  Health Maintenance: Immunizations -- UpToDate  Colonoscopy -- due next month -- agreeable to referral Mammogram -- UpToDate  PAP -- UpToDate  Diet -- healthy diet Exercise -- regular exercise as able  Sleep habits -- no major concerns Mood -- stable  UTD with dentist? - yes UTD with eye doctor? - yes  Weight history: Wt Readings from Last 10 Encounters:  10/21/23 169 lb (76.7 kg)  07/20/23 174 lb (78.9 kg)  05/11/23 172 lb (78 kg)  03/24/23 169 lb 4 oz (76.8 kg)  09/10/22 170 lb (77.1 kg)  03/29/22 167 lb (75.8 kg)  01/20/22 170 lb 6.1 oz (77.3 kg)  01/02/21 164 lb 6.4 oz (74.6 kg)  11/26/20 169 lb 6.4 oz (76.8 kg)  02/15/20 164 lb 3.2 oz (74.5 kg)   Body mass index is 25.7 kg/m. Patient's last menstrual period was 10/18/2023 (exact date).  Alcohol use:  reports current alcohol use of about 1.0 standard drink of alcohol per week.  Tobacco use:  Tobacco Use: Low Risk  (10/21/2023)   Patient History    Smoking Tobacco Use: Never    Smokeless Tobacco Use: Never    Passive Exposure: Not on file   Eligible for lung cancer  screening? no     10/21/2023    8:23 AM  Depression screen PHQ 2/9  Decreased Interest 0  Down, Depressed, Hopeless 0  PHQ - 2 Score 0     Other providers/specialists: Patient Care Team: Alexander Iba, [place] as PCP - General (Physician Assistant)    PMHx, SurgHx, SocialHx, Medications, and Allergies were reviewed in the Visit Navigator and updated as appropriate.   Past Medical History:  Diagnosis Date   Depression    PPD NO MEDS   History of concussion 04/19/2017   HPV in female    Vaginal Pap smear, abnormal      Past Surgical History:  Procedure Laterality Date   NO PAST SURGERIES       Family History  Problem Relation Age of Onset   High Cholesterol Mother    Hypertension Mother    Obesity Father    Colonic polyp Father    Anxiety disorder Sister    Depression Sister    Breast cancer Sister 48   Heart murmur Brother    Macular degeneration Maternal Grandfather    Breast cancer Paternal Grandmother 77   Stroke Paternal Grandfather    Breast cancer Maternal Aunt        72's    Social History   Tobacco Use   Smoking status: Never   Smokeless tobacco: Never  Vaping Use   Vaping status: Never  Used  Substance Use Topics   Alcohol use: Yes    Alcohol/week: 1.0 standard drink of alcohol    Types: 1 Glasses of wine per week   Drug use: Never    Review of Systems:   Review of Systems  Constitutional:  Negative for chills, fever, malaise/fatigue and weight loss.  HENT:  Negative for hearing loss, sinus pain and sore throat.   Respiratory:  Negative for cough and hemoptysis.   Cardiovascular:  Negative for chest pain, palpitations, leg swelling and PND.  Gastrointestinal:  Negative for abdominal pain, constipation, diarrhea, heartburn, nausea and vomiting.  Genitourinary:  Negative for dysuria, frequency and urgency.  Musculoskeletal:  Negative for back pain, myalgias and neck pain.  Skin:  Negative for itching and rash.  Neurological:  Positive for  headaches. Negative for dizziness, tingling and seizures.  Endo/Heme/Allergies:  Negative for polydipsia.  Psychiatric/Behavioral:  Negative for depression. The patient is not nervous/anxious.     Objective:   BP 110/60 (BP Location: Left Arm, Patient Position: Sitting, Cuff Size: Large)   Pulse 81   Temp (!) 97.5 F (36.4 C) (Temporal)   Ht 5\' 8"  (1.727 m)   Wt 169 lb (76.7 kg)   LMP 10/18/2023 (Exact Date)   SpO2 99%   BMI 25.70 kg/m  Body mass index is 25.7 kg/m.   General Appearance:    Alert, cooperative, no distress, appears stated age  Head:    Normocephalic, without obvious abnormality, atraumatic  Eyes:    PERRL, conjunctiva/corneas clear, EOM's intact, fundi    benign, both eyes  Ears:    Normal TM's and external ear canals, both ears  Nose:   Nares normal, septum midline, mucosa normal, no drainage    or sinus tenderness  Throat:   Lips, mucosa, and tongue normal; teeth and gums normal  Neck:   Supple, symmetrical, trachea midline, no adenopathy;    thyroid :  no enlargement/tenderness/nodules; no carotid   bruit or JVD  Back:     Symmetric, no curvature, ROM normal, no CVA tenderness  Lungs:     Clear to auscultation bilaterally, respirations unlabored  Chest Wall:    No tenderness or deformity   Heart:    Regular rate and rhythm, S1 and S2 normal, no murmur, rub or gallop  Breast Exam:    Deferred  Abdomen:     Soft, non-tender, bowel sounds active all four quadrants,    no masses, no organomegaly  Genitalia:    Deferred  Extremities:   Extremities normal, atraumatic, no cyanosis or edema  Pulses:   2+ and symmetric all extremities  Skin:   Skin color, texture, turgor normal, no rashes or lesions  Lymph nodes:   Cervical, supraclavicular, and axillary nodes normal  Neurologic:   CNII-XII intact, normal strength, sensation and reflexes    throughout    Assessment/Plan:   Routine physical examination Today patient counseled on age appropriate routine health  concerns for screening and prevention, each reviewed and up to date or declined. Immunizations reviewed and up to date or declined. Labs ordered and reviewed. Risk factors for depression reviewed and negative. Hearing function and visual acuity are intact. ADLs screened and addressed as needed. Functional ability and level of safety reviewed and appropriate. Education, counseling and referrals performed based on assessed risks today. Patient provided with a copy of personalized plan for preventive services.  Menstrual migraine without status migrainosus, not intractable Stop Imitrex  as this ineffective and she has been told by vascular provider  that she is at increased risk of stroke due for FMD of carotid artery -- she should not take any triptans We will trial Nurtec for preventative reasons per patient request Follow up in 3 months, sooner if concerns  Encounter for screening for other viral diseases Update hepatitis C screening  Encounter for lipid screening for cardiovascular disease Update lipid panel   FMD of carotid artery Discussed starting low dose aspirin (was recommended by vascular)    Alexander Iba, PA-C [facility]

## 2023-10-22 ENCOUNTER — Ambulatory Visit: Payer: Self-pay | Admitting: Physician Assistant

## 2023-10-22 LAB — HEPATITIS C ANTIBODY: Hepatitis C Ab: NONREACTIVE

## 2023-10-26 ENCOUNTER — Other Ambulatory Visit (HOSPITAL_COMMUNITY): Payer: Self-pay

## 2023-10-29 NOTE — Telephone Encounter (Signed)
 Pharmacy Patient Advocate Encounter  Received notification from [facility] that Prior Authorization for Nurtec 75 has been DENIED.  Full denial letter will be uploaded to the media tab. See denial reason below.   PA #/Case ID/Reference #: [id number]

## 2023-11-02 NOTE — Telephone Encounter (Signed)
Pleases see message and advise. ?

## 2023-11-03 NOTE — Telephone Encounter (Signed)
 Left message on voicemail to call office.

## 2023-11-04 NOTE — Telephone Encounter (Signed)
 Please resubmit PA for Nurtec 75 mg to use as needed for Migraines. Pt is having 5 migraines a month. See if we can get 8 tablets approved per month as needed for Migraines.

## 2023-11-04 NOTE — Telephone Encounter (Signed)
 Pt called back, told her Nurtec was denied by insurance for preventive use. Asked her how many migraines are you getting a month? Pt said about 5 per month. Told her okay will try to resubmit again for as needed use. Pt verbalized understanding.

## 2023-11-12 ENCOUNTER — Telehealth: Payer: Self-pay

## 2023-11-12 ENCOUNTER — Ambulatory Visit
Admission: RE | Admit: 2023-11-12 | Discharge: 2023-11-12 | Disposition: A | Discharge status: Home / Self Care | Source: Ambulatory Visit | Attending: Obstetrics and Gynecology | Admitting: Obstetrics and Gynecology

## 2023-11-12 ENCOUNTER — Other Ambulatory Visit (HOSPITAL_COMMUNITY): Payer: Self-pay

## 2023-11-12 ENCOUNTER — Encounter: Payer: Self-pay | Admitting: Gastroenterology

## 2023-11-12 DIAGNOSIS — Z1231 Encounter for screening mammogram for malignant neoplasm of breast: Secondary | ICD-10-CM

## 2023-11-12 NOTE — Telephone Encounter (Signed)
Appeal has been submitted. Will advise when response is received or follow up in 1 week. Please be advised that most companies may take 30 days to make a decision.

## 2023-12-06 ENCOUNTER — Encounter: Payer: Self-pay | Admitting: Physician Assistant

## 2023-12-14 NOTE — Telephone Encounter (Signed)
 Hello, any word on the appeal decision?

## 2023-12-21 DIAGNOSIS — D2371 Other benign neoplasm of skin of right lower limb, including hip: Secondary | ICD-10-CM | POA: Diagnosis not present

## 2023-12-21 DIAGNOSIS — L738 Other specified follicular disorders: Secondary | ICD-10-CM | POA: Diagnosis not present

## 2023-12-21 DIAGNOSIS — L920 Granuloma annulare: Secondary | ICD-10-CM | POA: Diagnosis not present

## 2023-12-27 ENCOUNTER — Other Ambulatory Visit (HOSPITAL_COMMUNITY): Payer: Self-pay

## 2023-12-28 ENCOUNTER — Telehealth: Payer: Self-pay

## 2023-12-28 ENCOUNTER — Telehealth: Payer: Self-pay | Admitting: Pharmacist

## 2023-12-28 ENCOUNTER — Other Ambulatory Visit (HOSPITAL_COMMUNITY): Payer: Self-pay

## 2023-12-28 NOTE — Telephone Encounter (Signed)
 Insurance has approved Nurtec as acute therapy for the patient's migraine's; Your request has been approved CaseId:[id number];Status:Approved;Review Type:Prior Auth;Coverage Start Date:11/28/2023;Coverage End Date:12/27/2024;

## 2023-12-28 NOTE — Telephone Encounter (Signed)
 Pharmacy Patient Advocate Encounter  Received notification from [employer] that Prior Authorization for Nurtec 75 has been APPROVED from 11/28/23 to 12/27/24. Ran test claim, Copay is $0.00. This test claim was processed through [facility]- copay amounts may vary at other pharmacies due to pharmacy/plan contracts, or as the patient moves through the different stages of their insurance plan.

## 2024-01-10 ENCOUNTER — Other Ambulatory Visit: Payer: Self-pay

## 2024-01-10 ENCOUNTER — Ambulatory Visit (AMBULATORY_SURGERY_CENTER)

## 2024-01-10 VITALS — Ht 68.0 in | Wt 170.0 lb

## 2024-01-10 DIAGNOSIS — Z1211 Encounter for screening for malignant neoplasm of colon: Secondary | ICD-10-CM

## 2024-01-10 MED ORDER — NA SULFATE-K SULFATE-MG SULF 17.5-3.13-1.6 GM/177ML PO SOLN: 1.0000 | Freq: Once | ORAL | 0 refills | Status: AC

## 2024-01-10 NOTE — Progress Notes (Signed)
 Denies allergies to eggs or soy products. Denies complication of anesthesia or sedation. Denies use of weight loss medication. Denies use of O2.   Emmi instructions given for colonoscopy.

## 2024-01-21 ENCOUNTER — Ambulatory Visit: Admitting: Physician Assistant

## 2024-01-25 ENCOUNTER — Encounter: Admitting: Gastroenterology

## 2024-01-28 ENCOUNTER — Encounter: Payer: Self-pay | Admitting: Gastroenterology

## 2024-01-31 ENCOUNTER — Encounter: Payer: Self-pay | Admitting: Physician Assistant

## 2024-01-31 ENCOUNTER — Encounter: Payer: Self-pay | Admitting: Gastroenterology

## 2024-01-31 ENCOUNTER — Encounter: Admitting: Gastroenterology

## 2024-01-31 NOTE — Telephone Encounter (Signed)
 She is welcome to have her primary care provider order her Cologuard test and contact us  if it is positive.  H Danis

## 2024-01-31 NOTE — Telephone Encounter (Signed)
 This patient was scheduled for a screening colonoscopy with me today.  She contacted the on-call physician Georgean) the night before with concerns about a possible allergic reaction to the Suprep. There were symptoms described in his note that were not clearly allergic in nature, but the patient did not feel comfortable proceeding with the second half of prep to have the procedure.  Please reschedule this patient for a colonoscopy with me or any next available physician a date convenient for her, and we will use Ducolax/MiraLAX prep.  VEAR Brand MD

## 2024-01-31 NOTE — Telephone Encounter (Signed)
 Dr. Legrand. I spoke with the pt in attempt to have her r/s. Pt spoke with PCP about cologuard prior to scheduling this procedure and is requesting to take that roue first and then proceed if that come back positive. Please advise if you're ok ordering this or if you prefer she go thru her PCP.   Thank you, PV

## 2024-01-31 NOTE — Telephone Encounter (Signed)
 POD B please disregard

## 2024-01-31 NOTE — Telephone Encounter (Signed)
 Patient contacted the on-call service last night with concerns of allergic reaction to her bowel prep.  She states that she completed the Suprep around 6 PM, but then awoke a little after midnight with diffuse itching and dry mouth. She did not have other symptoms such as a rash/hives, shortness of breath. 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  No other known drug allergies.  We discussed how it was not certain that her symptoms were due to allergic reaction to the Suprep, but it was not possible. I told her that given the absence of other symptoms, it would be reasonable to wait and see how she feels when is time to start the next prep.  But it would also be reasonable to cancel the procedure and forego any additional bowel prep.  I called the patient back this morning, and she states that she decided not to pursue any further bowel prep out of caution for an allergic reaction.  I told her that we would contact her to reschedule her colonoscopy with an alternative prep (MiraLAX?).  Will add Suprep to allergy list

## 2024-02-01 ENCOUNTER — Other Ambulatory Visit: Payer: Self-pay | Admitting: Physician Assistant

## 2024-02-01 DIAGNOSIS — Z1211 Encounter for screening for malignant neoplasm of colon: Secondary | ICD-10-CM

## 2024-02-07 DIAGNOSIS — Z1211 Encounter for screening for malignant neoplasm of colon: Secondary | ICD-10-CM | POA: Diagnosis not present

## 2024-02-14 ENCOUNTER — Ambulatory Visit: Payer: Self-pay | Admitting: Physician Assistant

## 2024-02-14 LAB — COLOGUARD: COLOGUARD: NEGATIVE

## 2024-02-17 DIAGNOSIS — B078 Other viral warts: Secondary | ICD-10-CM | POA: Diagnosis not present

## 2024-02-17 DIAGNOSIS — B079 Viral wart, unspecified: Secondary | ICD-10-CM | POA: Diagnosis not present

## 2024-02-28 ENCOUNTER — Encounter: Payer: Self-pay | Admitting: Vascular Surgery

## 2024-03-30 DIAGNOSIS — A630 Anogenital (venereal) warts: Secondary | ICD-10-CM | POA: Diagnosis not present

## 2024-03-30 DIAGNOSIS — Z23 Encounter for immunization: Secondary | ICD-10-CM | POA: Diagnosis not present

## 2024-04-02 DIAGNOSIS — B349 Viral infection, unspecified: Secondary | ICD-10-CM | POA: Diagnosis not present

## 2024-04-02 DIAGNOSIS — J0100 Acute maxillary sinusitis, unspecified: Secondary | ICD-10-CM | POA: Diagnosis not present

## 2024-04-10 DIAGNOSIS — L249 Irritant contact dermatitis, unspecified cause: Secondary | ICD-10-CM | POA: Diagnosis not present

## 2024-04-10 DIAGNOSIS — L299 Pruritus, unspecified: Secondary | ICD-10-CM | POA: Diagnosis not present

## 2024-05-08 ENCOUNTER — Encounter: Payer: Self-pay | Admitting: Physician Assistant

## 2024-05-08 ENCOUNTER — Ambulatory Visit (INDEPENDENT_AMBULATORY_CARE_PROVIDER_SITE_OTHER): Admitting: Physician Assistant

## 2024-05-08 VITALS — BP 106/70 | HR 76 | Temp 98.1°F | Ht 68.0 in | Wt 171.2 lb

## 2024-05-08 DIAGNOSIS — R21 Rash and other nonspecific skin eruption: Secondary | ICD-10-CM | POA: Diagnosis not present

## 2024-05-08 DIAGNOSIS — G43829 Menstrual migraine, not intractable, without status migrainosus: Secondary | ICD-10-CM | POA: Diagnosis not present

## 2024-05-08 NOTE — Progress Notes (Signed)
 History of Present Illness:   Chief Complaint  Patient presents with   Rash    Pt c/o red rash on arms and abdomen. Pt was treated for poison couple weeks ago and that resolved. Pt said she took Nurtec last week several doses and then the rash popped up.    Discussed the use of AI scribe software for clinical note transcription with the patient, who gave verbal consent to proceed.  History of Present Illness   Erin [name] is a 43 year old female who presents with a rash and itching potentially related to Nurtec use.  She developed a pruritic rash with small red "pinprick" spots on her arms and abdomen after taking Nurtec on Wednesday and Friday of last week and is worried about a drug reaction. She reports a similar itchy red-dot rash earlier this year while taking Ubrelvy in a migraine study. Itching from a prior poison ivy episode had resolved before this new rash began, which increases her concern that Nurtec is the trigger. She also notes past itching with Suprep for colonoscopy, which she attributes to a possible sulfate sensitivity.  Her current medications include as-needed Nurtec for migraines and occasional Benadryl  for itching. She keeps an EpiPen  for emergencies. She denies known environmental allergies other than poison ivy and has not had recent new soaps, detergents, or pet exposures.        Past Medical History:  Diagnosis Date   Depression    PPD NO MEDS   History of concussion 04/19/2017   HPV in female    Vaginal Pap smear, abnormal      Social History   Tobacco Use   Smoking status: Never   Smokeless tobacco: Never  Vaping Use   Vaping status: Never Used  Substance Use Topics   Alcohol use: Yes    Alcohol/week: 1.0 standard drink of alcohol    Types: 1 Glasses of wine per week    Comment: Rarely   Drug use: Never    Past Surgical History:  Procedure Laterality Date   LYMPH GLAND EXCISION N/A    Neck   NO PAST SURGERIES      Family History   Problem Relation Age of Onset   High Cholesterol Mother    Hypertension Mother    Obesity Father    Colonic polyp Father    Anxiety disorder Sister    Depression Sister    Breast cancer Sister 11   Heart murmur Brother    Breast cancer Maternal Aunt        60's   Macular degeneration Maternal Grandfather    Breast cancer Paternal Grandmother 2   Stroke Paternal Grandfather    Colon cancer Neg Hx    Esophageal cancer Neg Hx    Rectal cancer Neg Hx    Stomach cancer Neg Hx     Allergies  Allergen Reactions   Bee Venom Hives   Suprep Bowel Prep Kit [Na Sulfate-K Sulfate-Mg Sulf] Itching    Itching over abdomen, xerostomia noted after taking first half of Suprep.  No rash or other symptoms    Current Medications:   Current Outpatient Medications:    EPINEPHrine  0.3 mg/0.3 mL IJ SOAJ injection, Inject 0.3 mg into the muscle as needed for anaphylaxis., Disp: 2 each, Rfl: 0   EPINEPHrine  0.3 mg/0.3 mL IJ SOAJ injection, Inject 0.3 mg into the muscle as needed for anaphylaxis., Disp: 2 each, Rfl: 1   Multiple Vitamin (MULTI-VITAMIN) tablet, Take 1 tablet by  mouth daily., Disp: , Rfl:    Rimegepant Sulfate (NURTEC) 75 MG TBDP, Take 1 tablet (75 mg total) by mouth every other day., Disp: 16 tablet, Rfl: 11   Review of Systems:   Negative unless otherwise specified per HPI.  Vitals:   Vitals:   05/08/24 1522  BP: 106/70  Pulse: 76  Temp: 98.1 F (36.7 C)  TempSrc: Temporal  SpO2: 99%  Weight: 171 lb 4 oz (77.7 kg)  Height: 5' 8 (1.727 m)     Body mass index is 26.04 kg/m.  Physical Exam:   Physical Exam Vitals and nursing note reviewed.  Constitutional:      General: She is not in acute distress.    Appearance: She is well-developed. She is not ill-appearing or toxic-appearing.  Cardiovascular:     Rate and Rhythm: Normal rate and regular rhythm.     Pulses: Normal pulses.     Heart sounds: Normal heart sounds, S1 normal and S2 normal.  Pulmonary:      Effort: Pulmonary effort is normal.     Breath sounds: Normal breath sounds.  Skin:    General: Skin is warm and dry.  Neurological:     Mental Status: She is alert.     GCS: GCS eye subscore is 4. GCS verbal subscore is 5. GCS motor subscore is 6.  Psychiatric:        Speech: Speech normal.        Behavior: Behavior normal. Behavior is cooperative.     Assessment and Plan:   Assessment and Plan    Rash possibly drug-related Rash with pinprick red marks and itching, likely drug reaction to Nurtec. Rash onset after Nurtec use suggests drug-related etiology. - Referred to allergy specialist for comprehensive testing. - Advised to minimize Nurtec use until evaluation. - Discussed potential use of antihistamines like Claritin, Allegra, or Zyrtec if rash persists, but avoid starting new antihistamines before testing.  Menstrual migraine without status migrainosus, not intractable  Migraines managed with Nurtec, effective with minimal side effects. Concerns about potential drug reaction causing rash. - Continue Nurtec as needed, minimizing use until allergy evaluation is complete.       Lucie Buttner, PA-C

## 2024-06-09 ENCOUNTER — Encounter: Payer: Self-pay | Admitting: Vascular Surgery

## 2024-06-13 ENCOUNTER — Telehealth: Payer: Self-pay

## 2024-06-13 NOTE — Telephone Encounter (Signed)
 Spoke to pt to correct that Dr. Missy is with [facility]. I also spoke to his admin. Assist/coordinator Lebron Mania who is looking into the referral we placed back in September and again today. She was able to find imaging and office notes in Care Everywhere. I have faxed her pt demographics as requested. Pt is aware of the above and will call them later this week if she does not hear back from them before then.

## 2024-06-15 ENCOUNTER — Encounter: Payer: Self-pay | Admitting: Allergy

## 2024-06-15 ENCOUNTER — Other Ambulatory Visit: Payer: Self-pay

## 2024-06-15 ENCOUNTER — Ambulatory Visit: Payer: Self-pay | Admitting: Allergy

## 2024-06-15 VITALS — BP 102/70 | HR 68 | Temp 98.5°F | Ht 67.0 in | Wt 172.6 lb

## 2024-06-15 DIAGNOSIS — L237 Allergic contact dermatitis due to plants, except food: Secondary | ICD-10-CM

## 2024-06-15 DIAGNOSIS — G43809 Other migraine, not intractable, without status migrainosus: Secondary | ICD-10-CM | POA: Diagnosis not present

## 2024-06-15 DIAGNOSIS — T63481A Toxic effect of venom of other arthropod, accidental (unintentional), initial encounter: Secondary | ICD-10-CM | POA: Diagnosis not present

## 2024-06-15 DIAGNOSIS — T50905D Adverse effect of unspecified drugs, medicaments and biological substances, subsequent encounter: Secondary | ICD-10-CM

## 2024-06-15 DIAGNOSIS — Z888 Allergy status to other drugs, medicaments and biological substances status: Secondary | ICD-10-CM

## 2024-06-15 DIAGNOSIS — R239 Unspecified skin changes: Secondary | ICD-10-CM

## 2024-06-15 MED ORDER — TRIAMCINOLONE ACETONIDE 0.1 % EX OINT: 1.0000 | TOPICAL_OINTMENT | Freq: Two times a day (BID) | CUTANEOUS | 5 refills | Status: AC | PRN

## 2024-06-15 MED ORDER — EPINEPHRINE 0.3 MG/0.3ML IJ SOAJ: 0.3000 mg | INTRAMUSCULAR | 0 refills | Status: AC | PRN

## 2024-06-15 NOTE — Progress Notes (Signed)
 "   New Patient Note  RE: Erin [name] MRN: [id number] DOB: Mar 28, 1981 Date of Office Visit: 06/15/2024  Primary care provider: Job Lukes, PA  Chief Complaint: rash  History of present illness: Erin [name] is a 44 y.o. female presenting today for evaluation of rash.  Discussed the use of AI scribe software for clinical note transcription with the patient, who gave verbal consent to proceed.  She has a history of skin problems since childhood, including frequent hives as a infant/young child, as reported to her by her mother.  Two years ago, she experienced a significant allergic reaction after being stung by a honey bee through her beekeeping suit on her chin, resulting in full-body hives and necessitating a 911 call. She has since stopped beekeeping and has not been stung by a honeybee since, but did experience a sting from a yellow jacket without significant issues just localized swelling. She has been stung by ants on her property, which causes significant localized swelling.   She describes a severe allergic reaction to poison ivy, for which she has required steroid shots and prednisone  for management. Despite efforts to avoid exposure, she frequently encounters poison ivy due to her gardening activities.   She reports a suspected allergic reaction to Nurtec, a medication she was switched to for migraines, which caused a rash and itching.  On the same time she had been battling with a poison ivy episode on her arm.  She is not sure if the poison ivy reaction that had any contribution to the rash and itching she developed on her abdomen that she was looking to Nurtec.  This was stopped and changed to a different agent.  She also experienced a similar reaction to Suprep, used for colonoscopy preparation, and suspects a possible sulfate allergy  sulfates were a common ingredient between both the Suprep and the Nurtec.  She experiences migraines, which she suspects may be triggered by  dietary or environmental factors. She notes an increase in frequency and severity of migraines, possibly related to perimenopause. She is concerned about potential food or environmental triggers.  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 She uses a topical steroid cream for management.  She has been following with dermatology and has had biopsies done.  Review of systems: 10pt ROS negative unless noted above in HPI  Past medical history: Past Medical History:  Diagnosis Date   Depression    PPD NO MEDS   History of concussion 04/19/2017   HPV in female    Vaginal Pap smear, abnormal     Past surgical history: Past Surgical History:  Procedure Laterality Date   LYMPH GLAND EXCISION N/A    Neck   NO PAST SURGERIES      Family history:  Family History  Problem Relation Age of Onset   Allergic rhinitis Mother    High Cholesterol Mother    Hypertension Mother    Obesity Father    Colonic polyp Father    Allergic rhinitis Sister    Anxiety disorder Sister    Depression Sister    Breast cancer Sister 58   Heart murmur Brother    Breast cancer Maternal Aunt        60's   Macular degeneration Maternal Grandfather    Breast cancer Paternal Grandmother 13   Stroke Paternal Grandfather    Colon cancer Neg Hx    Esophageal cancer Neg Hx  Rectal cancer Neg Hx    Stomach cancer Neg Hx     Social history: Lives in a home without carpeting with gas and heat pump heating with heat pump cooling.  Leopard gecko and fish in home.  Maybe concern for water damage/mildew in home and occasional roaches in the home.  Works as a producer, television/film/video.  Denies smoking history.    Medication List: Current Outpatient Medications  Medication Sig Dispense Refill   EPINEPHrine  0.3 mg/0.3 mL IJ SOAJ injection Inject 0.3 mg into the muscle as needed for anaphylaxis. 2 each 0   Multiple Vitamin (MULTI-VITAMIN) tablet  Take 1 tablet by mouth daily.     EPINEPHrine  0.3 mg/0.3 mL IJ SOAJ injection Inject 0.3 mg into the muscle as needed for anaphylaxis. 2 each 1   Rimegepant Sulfate (NURTEC) 75 MG TBDP Take 1 tablet (75 mg total) by mouth every other day. (Patient not taking: Reported on 06/15/2024) 16 tablet 11   No current facility-administered medications for this visit.    Known medication allergies: Allergies[1]   Physical examination: Blood pressure 102/70, pulse 68, temperature 98.5 F (36.9 C), temperature source Temporal, height 5' 7 (1.702 m), weight 172 lb 9.6 oz (78.3 kg), SpO2 99%.  General: Alert, interactive, in no acute distress. HEENT: PERRLA, TMs pearly gray, turbinates non-edematous without discharge, post-pharynx non erythematous. Neck: Supple without lymphadenopathy. Lungs: Clear to auscultation without wheezing, rhonchi or rales. {no increased work of breathing. CV: Normal S1, S2 without murmurs. Abdomen: Nondistended, nontender. Skin: Warm and dry, without lesions or rashes. Extremities:  No clubbing, cyanosis or edema. Neuro:   Grossly intact.  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 Venom shots discussed but likely not necessary at this time due to cessation of beekeeping.  - Ordered blood work for stinging insect panel including honey bee, wasp, hornet, and yellow jacket venom as well as fire  ant. 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 No preventative therapy available other than avoidance. - Recommended having topical steroids (Triamcinolone ) available for immediate use upon exposure to poison ivy. - Advised on  avoidance strategies to prevent exposure to poison ivy.  Suspected drug allergy  Suspected allergy  to Nurtec and Suprep with rash and itching. No stan dardized testing available for these drugs. Alternative medications available to use. - Will investigate protocols for testing directly to the medication if necessary. - Will consider skin testing for medications if indicated. - Use alternative medications if available.  Migraine Increasing frequency and severity of migraines, possibly related to environmental factors or diet. Potential link to environmental allergens discussed. - Scheduled skin testing for environmental and food allergens. - Provided food testing sheet for identification of potential dietary triggers. - Advised on avoidance of antihistamines for three days prior to skin testing.  Schedule skin testing visit and hold antihistamines for 3 days prior to testing (Env 1-55 + foods - see food sheet in inbox)  I appreciate the opportunity to take part in [facility] care. Please do not hesitate to contact me with questions.  Sincerely,   Danita Brain, MD Allergy /Immunology [facility]     [1]  Allergies Allergen Reactions   Bee Venom Hives  Suprep Bowel Prep Kit [Na Sulfate-K Sulfate-Mg Sulf] Itching    Itching over abdomen, xerostomia noted after taking first half of Suprep.  No rash or other symptoms   "

## 2024-06-15 NOTE — Patient Instructions (Addendum)
 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 Venom shots discussed but likely not necessary at this time due to cessation of beekeeping.  - Ordered blood work for stinging insect panel including honey bee, wasp, hornet, and yellow jacket venom as well as fire  ant. 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 No preventative therapy available other than avoidance. - Recommended having topical steroids (Triamcinolone ) available for immediate use upon exposure to poison ivy. - Advised on avoidance strategies to prevent exposure to poison ivy.  Suspected drug allergy  Suspected allergy  to Nurtec and Suprep with rash and itching. No stan dardized testing available for these drugs. Alternative medications available to use. - Will investigate protocols for testing directly to the medication if necessary. - Will consider skin testing for medications if indicated. - Use alternative medications if available.  Migraine Increasing frequency and severity of migraines, possibly related to environmental factors or diet. Potential link to environmental allergens discussed. - Scheduled skin testing for environmental and food allergens. - Provided food testing sheet for identification of potential dietary triggers. - Advised on avoidance of antihistamines for three days prior to skin testing.  Schedule skin testing visit and hold antihistamines for 3 days prior to testing

## 2024-06-18 LAB — HYMENOPTERA VENOM ALLERGY II

## 2024-06-19 LAB — HYMENOPTERA VENOM ALLERGY II
Bumblebee: <0.1 kU/L
Hornet, White Face, IgE: <0.1 kU/L
Hornet, Yellow, IgE: <0.1 kU/L
I001-IgE Honeybee: 12.6 kU/L — AB
I208-IgE Api m 1: 6.88 kU/L — AB
I209-IgE Ves v 5: <0.1 kU/L
I211-IgE Ves v 1: <0.1 kU/L
I211-IgE Ves v 1: <0.1 kU/L
I214-IgE Api m 2: <0.1 kU/L
I215-IgE Api m 3: 0.55 kU/L — AB
I216-IgE Api m 5: <0.1 kU/L
I217-IgE Api m 10: 0.66 kU/L — AB
Reflex Information: <0.1 kU/L
Reflex Information: <0.1 kU/L
Tryptase: 3.7 ug/L (ref 2.2–13.2)

## 2024-06-19 LAB — ALLERGEN COMPONENT COMMENTS

## 2024-06-19 LAB — ALLERGEN FIRE ANT: I070-IgE Fire Ant (Invicta): <0.1 kU/L

## 2024-06-22 ENCOUNTER — Ambulatory Visit: Payer: Self-pay | Admitting: Allergy

## 2024-07-05 ENCOUNTER — Encounter: Payer: Self-pay | Admitting: Allergy

## 2024-07-05 ENCOUNTER — Ambulatory Visit: Admitting: Allergy

## 2024-07-05 DIAGNOSIS — R0981 Nasal congestion: Secondary | ICD-10-CM

## 2024-07-05 DIAGNOSIS — J3089 Other allergic rhinitis: Secondary | ICD-10-CM | POA: Diagnosis not present

## 2024-07-05 DIAGNOSIS — G43809 Other migraine, not intractable, without status migrainosus: Secondary | ICD-10-CM

## 2024-07-05 NOTE — Patient Instructions (Addendum)
 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 No preventative therapy available other than avoidance. - Recommended having topical steroids (Triamcinolone ) available for immediate use upon exposure to poison ivy. - Advised on avoidance strategies to prevent exposure to poison ivy.  Suspected drug allergy  Suspected allergy  to Nurtec and Suprep with rash and itching. No standardized testing available for these drugs. Alternative medications available to use. - Investigating options/protocols for testing directly to the medication if necessary. - Will consider skin testing for medications if indicated. - Use alternative medications if available.  Migraine/sinus congestion Increasing frequency and severity of migraines, possibly related to environmental factors or diet. Potential link to environmental allergens discussed.  - Food allergy  testing showed: negative to egg and pineapple - Environmental allergy  testing today showed: grasses, weeds, trees, indoor molds, outdoor molds, dust mites, and cat - Copy of test results provided.  - Avoidance measures provided. - Can take for allergy  symptom relief/control antihistamine like Xyzal, Allegra or Zyrtec daily as needed - Can take for nasal/sinus congestion Nasacort , Rhinocort, Nasonex or Flonase 2 sprays each nostril daily for 1-2 weeks at a time before stopping once nasal congestion improves for maximum benefit   Follow-up in 6-12 months or  sooner if needed   Avoidance measures: Reducing Pollen Exposure  The American Academy of All[employer] , Asthma and Immun[employer] suggests the following steps to reduce your exposure to pollen during allergy  seasons.    Do not hang sheets or clothing out to dry; pollen may collect on these items. Do not mow lawns or spend time around freshly cut grass; mowing stirs up pollen. Keep windows closed at night.  Keep car windows closed while driving. Minimize morning activities outdoors, a time when pollen counts are usually at their highest. Stay indoors as much as possible when pollen counts or humidity is high and on windy days when pollen tends to remain in the air longer. Use air conditioning when possible.  Many air conditioners have filters that trap the pollen spores. Use a HEPA room air filter to remove pollen form the indoor air you breathe.   Control of Mold Allergen   Mold and fungi can grow on a variety of surfaces provided certain temperature and moisture conditions exist.  Outdoor molds grow on plants, decaying vegetation and soil.  The major outdoor mold, Alternaria and Cladosporium, are found in very high numbers during hot and dry conditions.  Generally, a late Summer - Fall peak is seen for common outdoor fungal spores.  Rain will temporarily lower outdoor mold spore count, but counts rise rapidly when the rainy period ends.  The most important indoor molds are Aspergillus and Penicillium.  Dark, humid and poorly ventilated basements are ideal sites for mold growth.  The next most common sites of mold growth are the bathroom and the kitchen.  Outdoor (Seasonal) Mold Control Use air conditioning and keep windows closed Avoid exposure to decaying vegetation. Avoid leaf raking. Avoid grain handling. Consider wearing a face mask if working in moldy areas.   Indoor (Perennial) Mold  Control  Maintain humidity below 50%. Clean washable surfaces with 5% bleach solution. Remove sources e.g.  contaminated carpets.  Control of Dust Mite Allergen    Dust mites play a major role in allergic asthma and rhinitis.  They occur in environments with high humidity wherever human skin is found.  Dust mites absorb humidity from the atmosphere (ie, they do not drink) and feed on organic matter (including shed human and animal skin).  Dust mites are a microscopic type of insect that you cannot see with the naked eye.  High levels of dust mites have been detected from mattresses, pillows, carpets, upholstered furniture, bed covers, clothes, soft toys and any woven material.  The principal allergen of the dust mite is found in its feces.  A gram of dust may contain 1,000 mites and 250,000 fecal particles.  Mite antigen is easily measured in the air during house cleaning activities.  Dust mites do not bite and do not cause harm to humans, other than by triggering allergies/asthma.    Ways to decrease your exposure to dust mites in your home:  Encase mattresses, box springs and pillows with a mite-impermeable barrier or cover   Wash sheets, blankets and drapes weekly in hot water (130 F) with detergent and dry them in a dryer on the hot setting.  Have the room cleaned frequently with a vacuum cleaner and a damp dust-mop.  For carpeting or rugs, vacuuming with a vacuum cleaner equipped with a high-efficiency particulate air (HEPA) filter.  The dust mite allergic individual should not be in a room which is being cleaned and should wait 1 hour after cleaning before going into the room. Do not sleep on upholstered furniture (eg, couches).   If possible removing carpeting, upholstered furniture and drapery from the home is ideal.  Horizontal blinds should be eliminated in the rooms where the person spends the most time (bedroom, study, television room).  Washable vinyl, roller-type shades are optimal. Remove all non-washable stuffed toys from the bedroom.  Wash stuffed toys weekly like sheets and blankets above.    Reduce indoor humidity to less than 50%.  Inexpensive humidity monitors can be purchased at most hardware stores.  Do not use a humidifier as can make the problem worse and are not recommended.   Control of Dog or Cat Allergen  Avoidance is the best way to manage a dog or cat allergy . If you have a dog or cat and are allergic to dog or cats, consider removing the dog or cat from the home. If you have a dog or cat but dont want to find it a new home, or if your family wants a pet even though someone in the household is allergic, here are some strategies that may help keep symptoms at bay:  Keep the pet out of your bedroom and restrict it to only a few rooms. Be advised that keeping the dog or cat in only one room will not limit the allergens to that room. Dont pet, hug or kiss the dog or cat; if you do, wash your hands with soap and water. High-efficiency particulate air (HEPA) cleaners run continuously in a bedroom or living room can reduce allergen levels over time. Regular use of a high-efficiency vacuum cleaner or a central vacuum can reduce allergen levels. Giving your dog or cat a bath at least once a week can reduce airborne allergen.

## 2024-07-05 NOTE — Progress Notes (Signed)
 "   Follow-up Note  RE: Erin [name] MRN: [id number] DOB: Apr 10, 1981 Date of Office Visit: 07/05/2024   History of present illness: Erin [name] is a 44 y.o. female presenting today for skin testing visit.  She was last seen in the office on 06/16/23 for hymenoptera allergy , contact dermatitis to poison ivy, suspected drug allergy  and migraines.  She is in her usual state of health today without recent illness.  She has held antihistamines for at least 3 days for testing today.    Medication List: Current Outpatient Medications  Medication Sig Dispense Refill   EPINEPHrine  0.3 mg/0.3 mL IJ SOAJ injection Inject 0.3 mg into the muscle as needed for anaphylaxis. 2 each 1   EPINEPHrine  0.3 mg/0.3 mL IJ SOAJ injection Inject 0.3 mg into the muscle as needed for anaphylaxis. 2 each 0   Multiple Vitamin (MULTI-VITAMIN) tablet Take 1 tablet by mouth daily.     Rimegepant Sulfate (NURTEC) 75 MG TBDP Take 1 tablet (75 mg total) by mouth every other day. (Patient not taking: Reported on 06/15/2024) 16 tablet 11   triamcinolone  ointment (KENALOG ) 0.1 % Apply 1 Application topically 2 (two) times daily as needed. 30 g 5   No current facility-administered medications for this visit.     Known medication allergies: Allergies[1]   Diagnostics/Labs: Labs:  Component     Latest Ref Rng 06/15/2024  Bumblebee     Class 0 kU/L <0.10   Hornet, White Face, IgE     Class 0 kU/L <0.10   Hornet, Yellow, IgE     Class 0 kU/L <0.10   I001-IgE Honeybee     Class IV kU/L 12.60 !   I208-IgE Api m 1     Class IV kU/L 6.88 !   I214-IgE Api m 2     Class 0 kU/L <0.10   I215-IgE Api m 3     Class I kU/L 0.55 !   I216-IgE Api m 5     Class 0 kU/L <0.10   I217-IgE Api m 10     Class II kU/L 0.66 !   Yellow Jacket, IgE     Class 0 kU/L <0.10   I211-IgE Ves v 1     Class 0 kU/L <0.10   I209-IgE Ves v 5     Class 0 kU/L <0.10   Paper Wasp IgE     Class 0 kU/L <0.10   I210-IgE Pol d 5     Class 0 kU/L  <0.10   Tryptase     2.2 - 13.2 ug/L 3.7   I070-IgE Fire  Ant (Invicta)     Class 0 kU/L <0.10   Allergen Comments Note    Allergy  testing:   Airborne Adult Perc - 07/05/24 0844     Time Antigen Placed 9155    Allergen Manufacturer Jestine    Location Back    Number of Test 55    1. Control-Buffer 50% Glycerol Negative    2. Control-Histamine 2+    3. Bahia 2+    4. Bermuda 2+    5. Johnson 2+    6. [place]  Blue 2+    7. Meadow Fescue 2+    8. Perennial Rye 2+    9. Timothy 2+    10. Ragweed Mix Negative    11. Cocklebur 2+    12. Plantain,  English Negative    13. Baccharis 2+    14. Dog Fennel Negative    15. Russian Thistle Negative  16. Lamb's Quarters 2+    17. Sheep Sorrell Negative    18. Rough Pigweed Negative    19. Marsh Elder, Rough Negative    20. Mugwort, Common 2+    21. Box, Elder 2+    22. Cedar, red 2+    23. Sweet Gum 2+    24. Pecan Pollen 2+    25. Pine Mix 2+    26. Walnut, Black Pollen 2+    27. Red Mulberry 2+    28. Ash Mix 2+    29. Birch Mix Negative    30. Beech American Negative    31. Cottonwood, Eastern 2+    32. Hickory, White Negative    33. Maple Mix Negative    34. Oak, Eastern Mix 2+    35. Sycamore Eastern Negative    36. Alternaria Alternata Negative    37. Cladosporium Herbarum 2+    38. Aspergillus Mix Negative    39. Penicillium Mix 2+    40. Bipolaris Sorokiniana (Helminthosporium) Negative    41. Drechslera Spicifera (Curvularia) Negative    42. Mucor Plumbeus 2+    43. Fusarium Moniliforme Negative    44. Aureobasidium Pullulans (pullulara) 2+    45. Rhizopus Oryzae 2+    46. Botrytis Cinera Negative    47. Epicoccum Nigrum Negative    48. Phoma Betae Negative    49. Dust Mite Mix 4+    50. Cat Hair 10,000 BAU/ml 2+    51.  Dog Epithelia Negative    52. Mixed Feathers Negative    53. Horse Epithelia Negative    54. Cockroach, German Negative    55. Tobacco Leaf Negative          Food Adult Perc -  07/05/24 0800     Time Antigen Placed 9155    Allergen Manufacturer Jestine    Location Back    Number of allergen test 2          Allergy  testing results were read and interpreted by provider, documented by clinical staff.   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 No preventative therapy available other than avoidance. - Recommended having topical steroids (Triamcinolone ) available for immediate use upon exposure to poison ivy. - Advised on avoidance strategies to prevent exposure to poison ivy.  Suspected drug allergy  Suspected allergy  to Nurtec and Suprep with rash and itching. No standardized testing available for these drugs. Alternative medications available to use. - Investigating options/protocols for testing directly to the medication if necessary. - Will consider skin testing for medications if indicated. - Use alternative medications if available.  Migraine/sinus congestion Increasing frequency and severity of migraines, possibly related to environmental factors or diet. Potential link to environmental allergens discussed.  - Food allergy  testing showed: negative to egg and pineapple - Environmental allergy  testing today showed: grasses, weeds, trees, indoor molds, outdoor molds, dust mites, and cat - Copy of test results provided.  - Avoidance measures provided. - Can take for allergy  symptom relief/control  antihistamine like Xyzal,  Allegra or Zyrtec daily as needed - Can take for nasal/sinus congestion Nasacort , Rhinocort, Nasonex or Flonase 2 sprays each nostril daily for 1-2 weeks at a time before stopping once nasal congestion improves for maximum benefit   Follow-up in 6-12 months or sooner if needed  I appreciate the opportunity to take part in [facility] care. Please do not hesitate to contact me with questions.  Sincerely,   Danita Brain, MD Allergy /Immunology [facility]      [1]  Allergies Allergen Reactions   Bee Venom Hives   Suprep Bowel Prep Kit [Na Sulfate-K Sulfate-Mg Sulf] Itching    Itching over abdomen, xerostomia noted after taking first half of Suprep.  No rash or other symptoms   "

## 2025-01-04 ENCOUNTER — Ambulatory Visit: Admitting: Allergy
